# Patient Record
Sex: Female | Born: 1960 | Race: Asian | Hispanic: No | Marital: Married | Smoking: Never smoker
Health system: Southern US, Community
[De-identification: ages and names within clinical notes are randomized; demographics above are authoritative.]

## PROBLEM LIST (undated history)

## (undated) DIAGNOSIS — T7840XA Allergy, unspecified, initial encounter: Secondary | ICD-10-CM

## (undated) DIAGNOSIS — D219 Benign neoplasm of connective and other soft tissue, unspecified: Secondary | ICD-10-CM

## (undated) DIAGNOSIS — Z872 Personal history of diseases of the skin and subcutaneous tissue: Secondary | ICD-10-CM

## (undated) HISTORY — DX: Personal history of diseases of the skin and subcutaneous tissue: Z87.2

## (undated) HISTORY — DX: Allergy, unspecified, initial encounter: T78.40XA

## (undated) HISTORY — DX: Benign neoplasm of connective and other soft tissue, unspecified: D21.9

## (undated) HISTORY — PX: OTHER SURGICAL HISTORY: SHX169

---

## 2009-03-16 ENCOUNTER — Ambulatory Visit: Payer: Self-pay | Admitting: Family Medicine

## 2009-03-16 DIAGNOSIS — J069 Acute upper respiratory infection, unspecified: Secondary | ICD-10-CM | POA: Insufficient documentation

## 2011-09-29 ENCOUNTER — Ambulatory Visit (INDEPENDENT_AMBULATORY_CARE_PROVIDER_SITE_OTHER): Payer: BC Managed Care – PPO | Admitting: Physician Assistant

## 2011-09-29 ENCOUNTER — Encounter: Payer: Self-pay | Admitting: Physician Assistant

## 2011-09-29 VITALS — BP 122/84 | HR 65 | Ht 61.0 in | Wt 105.0 lb

## 2011-09-29 DIAGNOSIS — Z7189 Other specified counseling: Secondary | ICD-10-CM

## 2011-09-29 DIAGNOSIS — T753XXA Motion sickness, initial encounter: Secondary | ICD-10-CM

## 2011-09-29 DIAGNOSIS — Z23 Encounter for immunization: Secondary | ICD-10-CM

## 2011-09-29 DIAGNOSIS — Z789 Other specified health status: Secondary | ICD-10-CM

## 2011-09-29 MED ORDER — CIPROFLOXACIN HCL 500 MG PO TABS: 500.0000 mg | ORAL_TABLET | Freq: Two times a day (BID) | ORAL | Status: AC

## 2011-09-29 MED ORDER — TYPHOID VACCINE PO CPDR: 1.0000 | DELAYED_RELEASE_CAPSULE | ORAL | Status: AC

## 2011-09-29 NOTE — Progress Notes (Signed)
  Subjective:    Patient ID: Karen Copeland, female    DOB: 1960/05/20, 51 y.o.   MRN: [id number]  HPI Patient presents to the clinic to establish care and to get vaccines needed for travel to [place] leaving on June 5th. I discussed with patient that we really needed more time than 2 weeks to get everything done but will do what we can. Patient does not know if she has had Hep A and Hep B vaccines. She does not want Rabies, Japanese encephalitis, or Malaria. She does want yellow fever, tetanus, Hep a and b, and travelers diarrhea prophylaxis.   No other concerns today.    Review of Systems     Objective:   Physical Exam  Constitutional: She is oriented to person, place, and time. She appears well-developed and well-nourished.  HENT:  Head: Normocephalic and atraumatic.  Cardiovascular: Normal rate, regular rhythm and normal heart sounds.   Pulmonary/Chest: Effort normal and breath sounds normal.  Neurological: She is alert and oriented to person, place, and time.  Skin: Skin is warm and dry.  Psychiatric: She has a normal mood and affect. Her behavior is normal.          Assessment & Plan:  Travel, Foreign- Labs were drawn for Hep A and B titers. If no immunity then will give first two doses of 3 dose series. We don't have Yellow fever patient was told to go downstairs to occupational health; however, they do not file with insurance and will be 139 dollars. Patient is unsure that she wants that vaccine. Patient declined Japanese encephalitis, Malaria, Rabies. Typhoid oral vaccine was given rx for to start today. Tetanus will be given when find out if need hep A or B or not. Cipro was given to use if have diarrhea while traveling.  Discussed with patient the need for CPE and Pap. We can talk about referrals and past due healthcare items.

## 2011-09-29 NOTE — Patient Instructions (Signed)
Start tyhphoid oral vaccine today.

## 2011-09-30 LAB — HEPATITIS B CORE ANTIBODY, TOTAL: Hep B Core Total Ab: POSITIVE — AB

## 2011-10-01 ENCOUNTER — Other Ambulatory Visit: Payer: Self-pay | Admitting: Physician Assistant

## 2011-10-01 DIAGNOSIS — Z789 Other specified health status: Secondary | ICD-10-CM

## 2011-10-01 NOTE — Progress Notes (Signed)
Addended by: Ellsworth Lennox on: 10/01/2011 10:31 AM   Modules accepted: Orders

## 2011-10-01 NOTE — Progress Notes (Signed)
Fax these order to lab.

## 2011-10-07 ENCOUNTER — Telehealth: Payer: Self-pay | Admitting: *Deleted

## 2011-10-07 NOTE — Telephone Encounter (Signed)
Pt is calling in regards to what vaccines she needs to travel out of country. Please advise. I don't see where the further testing has come back.

## 2011-10-08 ENCOUNTER — Ambulatory Visit (INDEPENDENT_AMBULATORY_CARE_PROVIDER_SITE_OTHER): Payer: BC Managed Care – PPO | Admitting: Physician Assistant

## 2011-10-08 ENCOUNTER — Encounter: Payer: Self-pay | Admitting: Physician Assistant

## 2011-10-08 VITALS — BP 116/76 | HR 80 | Ht 61.0 in | Wt 104.0 lb

## 2011-10-08 DIAGNOSIS — J069 Acute upper respiratory infection, unspecified: Secondary | ICD-10-CM

## 2011-10-08 DIAGNOSIS — Z789 Other specified health status: Secondary | ICD-10-CM

## 2011-10-08 NOTE — Patient Instructions (Signed)
Start Mucinex-D twice a day drinking lots of water. Stop Zyrtec and allergra. Consider sinus rinses can do those twice a day. For cough continue the tussin at night and delsylm during the day. Honey for cough. Call on Monday before trip if not any better and will call in abx.   Upper Respiratory Infection, Adult An upper respiratory infection (URI) is also known as the common cold. It is often caused by a type of germ (virus). Colds are easily spread (contagious). You can pass it to others by kissing, coughing, sneezing, or drinking out of the same glass. Usually, you get better in 1 or 2 weeks.  HOME CARE   Only take medicine as told by your doctor.   Use a warm mist humidifier or breathe in steam from a hot shower.   Drink enough water and fluids to keep your pee (urine) clear or pale yellow.   Get plenty of rest.   Return to work when your temperature is back to normal or as told by your doctor. You may use a face mask and wash your hands to stop your cold from spreading.  GET HELP RIGHT AWAY IF:   After the first few days, you feel you are getting worse.   You have questions about your medicine.   You have chills, shortness of breath, or brown or red spit (mucus).   You have yellow or brown snot (nasal discharge) or pain in the face, especially when you bend forward.   You have a fever, puffy (swollen) neck, pain when you swallow, or white spots in the back of your throat.   You have a bad headache, ear pain, sinus pain, or chest pain.   You have a high-pitched whistling sound when you breathe in and out (wheezing).   You have a lasting cough or cough up blood.   You have sore muscles or a stiff neck.  MAKE SURE YOU:   Understand these instructions.   Will watch your condition.   Will get help right away if you are not doing well or get worse.  Document Released: 10/15/2007 Document Revised: 04/17/2011 Document Reviewed: 09/02/2010 [facility] Patient Information 2012  [place], [place].

## 2011-10-08 NOTE — Progress Notes (Signed)
  Subjective:    Patient ID: Karen Copeland, female    DOB: 01-26-1961, 51 y.o.   MRN: [id number]  HPI Patient presents to the clinic with cold like symptoms for 4 days. She has had an non productive cough and sinus pressure. Denies sore throat, fever, SOB, or ear pain. She has had some watery eyes. The cough is most bothersome. Tried Zyrtec, Allergra, Tussin. She feels like she is getting more congestion.   Patient is due to travel to [place]. We have discussed vaccines needed. She decline some b/c of cost. She needs Hep A and tetanus for sure. He does not want today b/c feels bad.    Review of Systems     Objective:   Physical Exam  Constitutional: She is oriented to person, place, and time. She appears well-developed and well-nourished.  HENT:  Head: Normocephalic and atraumatic.  Right Ear: External ear normal.  Left Ear: External ear normal.  Mouth/Throat: Oropharynx is clear and moist. No oropharyngeal exudate.       TM's normal. No maxillary tenderness. Bilateral turbinates red and swollen.   Eyes:       Watery discharge from both eyes.   Neck: Normal range of motion. Neck supple.  Cardiovascular: Normal rate, regular rhythm and normal heart sounds.   Pulmonary/Chest: Effort normal and breath sounds normal. She has no wheezes.  Lymphadenopathy:    She has no cervical adenopathy.  Neurological: She is alert and oriented to person, place, and time.  Skin: Skin is warm and dry.  Psychiatric: She has a normal mood and affect. Her behavior is normal.          Assessment & Plan:  URI-Start Mucinex-D twice a day drinking lots of water. Stop Zyrtec and allergra. Consider sinus rinses can do those twice a day. For cough continue the tussin at night and delsylm during the day. Honey for cough. Call on Monday before trip if not any better and will call in abx.   Travel- Patient did not want Hep A or tetanus shot today because feeling bad. She is aware of risk and patient was  encouraged to get vaccines. Hep B antibody were positive further testing was ordered to make sure not an active infection but due to immunitiy. Will call with results.

## 2011-10-08 NOTE — Telephone Encounter (Signed)
Discussed at appt on 10/08/11.

## 2011-10-10 ENCOUNTER — Other Ambulatory Visit: Payer: Self-pay | Admitting: Physician Assistant

## 2011-10-10 DIAGNOSIS — Z789 Other specified health status: Secondary | ICD-10-CM

## 2011-10-13 ENCOUNTER — Telehealth: Payer: Self-pay | Admitting: *Deleted

## 2011-10-13 MED ORDER — METHYLPREDNISOLONE 4 MG PO KIT: PACK | ORAL | Status: AC

## 2011-10-13 NOTE — Telephone Encounter (Signed)
Pt states that she still has congestion and that now her ears are stopped up. States she leaves to go out of country on Wednesday. Please advise.

## 2011-10-13 NOTE — Telephone Encounter (Signed)
Pt informed

## 2011-10-13 NOTE — Telephone Encounter (Signed)
Take medrol dose pak as directed on package instructions.

## 2011-11-17 ENCOUNTER — Encounter: Payer: Self-pay | Admitting: *Deleted

## 2012-12-31 ENCOUNTER — Encounter: Payer: Self-pay | Admitting: Obstetrics and Gynecology

## 2012-12-31 ENCOUNTER — Ambulatory Visit (INDEPENDENT_AMBULATORY_CARE_PROVIDER_SITE_OTHER): Payer: BC Managed Care – PPO | Admitting: Obstetrics and Gynecology

## 2012-12-31 VITALS — BP 100/58 | HR 88 | Ht 62.5 in | Wt 105.0 lb

## 2012-12-31 DIAGNOSIS — L659 Nonscarring hair loss, unspecified: Secondary | ICD-10-CM

## 2012-12-31 DIAGNOSIS — Z23 Encounter for immunization: Secondary | ICD-10-CM

## 2012-12-31 DIAGNOSIS — Z01419 Encounter for gynecological examination (general) (routine) without abnormal findings: Secondary | ICD-10-CM

## 2012-12-31 NOTE — Progress Notes (Signed)
Patient ID: Karen Copeland, female   DOB: 06/09/1960, 52 y.o.   MRN: [id number] 52 y.o.   Married    Asian   female   [id number]   here for annual exam.    Hot flashes not troublesome. Notes some hair loss. Has had thyroid check in the past.  Sees Dermatology for melasma - Dr. Verdie Mosher in [place].  Some vagina dryness  Patient's last menstrual period was 05/12/2010.          Sexually active: yes  The current method of family planning is post menopausal status.    Exercising: walks 4 days/week Last mammogram:  2012 [facility] Last pap smear: 2012 wnl History of abnormal pap: no Smoking: no Alcohol: no Last colonoscopy: never Last Bone Density:  never Last tetanus shot: greater than 10 years Last cholesterol check: 2013 [facility] work - [employer]  Hgb:                Urine:    Family History  Problem Relation Age of Onset  . Ovarian cancer Mother     ?poss. ovarian--unsure  . Diabetes Brother     as a child--but not as an adult    Patient Active Problem List   Diagnosis Date Noted  . URI 03/16/2009    Past Medical History  Diagnosis Date  . Allergy   . H/O melasma     History reviewed. No pertinent past surgical history.  Allergies: Review of patient's allergies indicates no known allergies.  Current Outpatient Prescriptions  Medication Sig Dispense Refill  . Multiple Vitamin (MULTIVITAMIN) capsule Take 1 capsule by mouth daily.      . TRI-LUMA 0.01-4-0.05 % CREA Apply 1 application topically as needed.      . Triprolidine-Pseudoephedrine (ANTIHISTAMINE PO) Take by mouth.       No current facility-administered medications for this visit.    ROS: Pertinent items are noted in HPI.  Social Hx:  Chemistry professor.  Exam:    BP 100/58  Pulse 88  Ht 5' 2.5" (1.588 m)  Wt 105 lb (47.628 kg)  BMI 18.89 kg/m2  LMP 05/12/2010   Wt Readings from Last 3 Encounters:  12/31/12 105 lb (47.628 kg)  10/08/11 104 lb (47.174 kg)  09/29/11 105 lb  (47.628 kg)     Ht Readings from Last 3 Encounters:  12/31/12 5' 2.5" (1.588 m)  10/08/11 5\' 1"  (1.549 m)  09/29/11 5\' 1"  (1.549 m)    General appearance: alert, cooperative and appears stated age Head: Normocephalic, without obvious abnormality, atraumatic Neck: no adenopathy, supple, symmetrical, trachea midline and thyroid not enlarged, symmetric, no tenderness/mass/nodules Lungs: clear to auscultation bilaterally Breasts: Inspection negative, No nipple retraction or dimpling, No nipple discharge or bleeding, No axillary or supraclavicular adenopathy, Normal to palpation without dominant masses Heart: regular rate and rhythm Abdomen: soft, non-tender; bowel sounds normal; no masses,  no organomegaly Extremities: extremities normal, atraumatic, no cyanosis or edema Skin: Skin color, texture, turgor normal. No rashes or lesions Lymph nodes: Cervical, supraclavicular, and axillary nodes normal. No abnormal inguinal nodes palpated Neurologic: Grossly normal   Pelvic: External genitalia:  no lesions              Urethra:  normal appearing urethra with no masses, tenderness or lesions              Bartholins and Skenes: normal                 Vagina: normal appearing vagina with  normal color and discharge, no lesions              Cervix: normal appearance              Pap taken: yes and high risk HPV        Bimanual Exam:  Uterus:  uterus is normal size, shape, consistency and nontender                                      Adnexa: normal adnexa in size, nontender and no masses                                      Rectovaginal: Confirms                                      Anus:  normal sphincter tone, no lesions  A: normal menopausal exam Atrophic vaginal changes.  Alpecia  P:     Mammogram due.  Order placed.  [facility] smear and high risk HPV testing. Discussed oil based lubricants and vitamin E suppositories for vaginal dryness. Discussed strategies for  osteoporosis prevention. TSH TDap today return annually or prn     An After Visit Summary was printed and given to the patient.

## 2012-12-31 NOTE — Patient Instructions (Signed)
EXERCISE AND DIET:  We recommended that you start or continue a regular exercise program for good health. Regular exercise means any activity that makes your heart beat faster and makes you sweat.  We recommend exercising at least 30 minutes per day at least 3 days a week, preferably 4 or 5.  We also recommend a diet low in fat and sugar.  Inactivity, poor dietary choices and obesity can cause diabetes, heart attack, stroke, and kidney damage, among others.    ALCOHOL AND SMOKING:  Women should limit their alcohol intake to no more than 7 drinks/beers/glasses of wine (combined, not each!) per week. Moderation of alcohol intake to this level decreases your risk of breast cancer and liver damage. And of course, no recreational drugs are part of a healthy lifestyle.  And absolutely no smoking or even second hand smoke. Most people know smoking can cause heart and lung diseases, but did you know it also contributes to weakening of your bones? Aging of your skin?  Yellowing of your teeth and nails?  CALCIUM AND VITAMIN D:  Adequate intake of calcium and Vitamin D are recommended.  The recommendations for exact amounts of these supplements seem to change often, but generally speaking 600 mg of calcium (either carbonate or citrate) and 800 units of Vitamin D per day seems prudent. Certain women may benefit from higher intake of Vitamin D.  If you are among these women, your doctor will have told you during your visit.    PAP SMEARS:  Pap smears, to check for cervical cancer or precancers,  have traditionally been done yearly, although recent scientific advances have shown that most women can have pap smears less often.  However, every woman still should have a physical exam from her gynecologist every year. It will include a breast check, inspection of the vulva and vagina to check for abnormal growths or skin changes, a visual exam of the cervix, and then an exam to evaluate the size and shape of the uterus and  ovaries.  And after 53 years of age, a rectal exam is indicated to check for rectal cancers. We will also provide age appropriate advice regarding health maintenance, like when you should have certain vaccines, screening for sexually transmitted diseases, bone density testing, colonoscopy, mammograms, etc.   MAMMOGRAMS:  All women over 40 years old should have a yearly mammogram. Many facilities now offer a "3D" mammogram, which may cost around $50 extra out of pocket. If possible,  we recommend you accept the option to have the 3D mammogram performed.  It both reduces the number of women who will be called back for extra views which then turn out to be normal, and it is better than the routine mammogram at detecting truly abnormal areas.    COLONOSCOPY:  Colonoscopy to screen for colon cancer is recommended for all women at age 50.  We know, you hate the idea of the prep.  We agree, BUT, having colon cancer and not knowing it is worse!!  Colon cancer so often starts as a polyp that can be seen and removed at colonscopy, which can quite literally save your life!  And if your first colonoscopy is normal and you have no family history of colon cancer, most women don't have to have it again for 10 years.  Once every ten years, you can do something that may end up saving your life, right?  We will be happy to help you get it scheduled when you are ready.    Be sure to check your insurance coverage so you understand how much it will cost.  It may be covered as a preventative service at no cost, but you should check your particular policy.    Tetanus, Diphtheria, Pertussis (Tdap) Vaccine What You Need to Know WHY GET VACCINATED? Tetanus, diphtheria and pertussis can be very serious diseases, even for adolescents and adults. Tdap vaccine can protect us from these diseases. TETANUS (Lockjaw) causes painful muscle tightening and stiffness, usually all over the body.  It can lead to tightening of muscles in the head  and neck so you can't open your mouth, swallow, or sometimes even breathe. Tetanus kills about 1 out of 5 people who are infected. DIPHTHERIA can cause a thick coating to form in the back of the throat.  It can lead to breathing problems, paralysis, heart failure, and death. PERTUSSIS (Whooping Cough) causes severe coughing spells, which can cause difficulty breathing, vomiting and disturbed sleep.  It can also lead to weight loss, incontinence, and rib fractures. Up to 2 in 100 adolescents and 5 in 100 adults with pertussis are hospitalized or have complications, which could include pneumonia and death. These diseases are caused by bacteria. Diphtheria and pertussis are spread from person to person through coughing or sneezing. Tetanus enters the body through cuts, scratches, or wounds. Before vaccines, the [place] saw as many as 200,000 cases a year of diphtheria and pertussis, and hundreds of cases of tetanus. Since vaccination began, tetanus and diphtheria have dropped by about 99% and pertussis by about 80%. TDAP VACCINE Tdap vaccine can protect adolescents and adults from tetanus, diphtheria, and pertussis. One dose of Tdap is routinely given at age 11 or 12. People who did not get Tdap at that age should get it as soon as possible. Tdap is especially important for health care professionals and anyone having close contact with a baby younger than 12 months. Pregnant women should get a dose of Tdap during every pregnancy, to protect the newborn from pertussis. Infants are most at risk for severe, life-threatening complications from pertussis. A similar vaccine, called Td, protects from tetanus and diphtheria, but not pertussis. A Td booster should be given every 10 years. Tdap may be given as one of these boosters if you have not already gotten a dose. Tdap may also be given after a severe cut or burn to prevent tetanus infection. Your doctor can give you more information. Tdap may safely  be given at the same time as other vaccines. SOME PEOPLE SHOULD NOT GET THIS VACCINE  If you ever had a life-threatening allergic reaction after a dose of any tetanus, diphtheria, or pertussis containing vaccine, OR if you have a severe allergy to any part of this vaccine, you should not get Tdap. Tell your doctor if you have any severe allergies.  If you had a coma, or long or multiple seizures within 7 days after a childhood dose of DTP or DTaP, you should not get Tdap, unless a cause other than the vaccine was found. You can still get Td.  Talk to your doctor if you:  have epilepsy or another nervous system problem,  had severe pain or swelling after any vaccine containing diphtheria, tetanus or pertussis,  ever had Guillain-Barr Syndrome (GBS),  aren't feeling well on the day the shot is scheduled. RISKS OF A VACCINE REACTION With any medicine, including vaccines, there is a chance of side effects. These are usually mild and go away on their own, but serious   reactions are also possible. Brief fainting spells can follow a vaccination, leading to injuries from falling. Sitting or lying down for about 15 minutes can help prevent these. Tell your doctor if you feel dizzy or light-headed, or have vision changes or ringing in the ears. Mild problems following Tdap (Did not interfere with activities)  Pain where the shot was given (about 3 in 4 adolescents or 2 in 3 adults)  Redness or swelling where the shot was given (about 1 person in 5)  Mild fever of at least 100.4F (up to about 1 in 25 adolescents or 1 in 100 adults)  Headache (about 3 or 4 people in 10)  Tiredness (about 1 person in 3 or 4)  Nausea, vomiting, diarrhea, stomach ache (up to 1 in 4 adolescents or 1 in 10 adults)  Chills, body aches, sore joints, rash, swollen glands (uncommon) Moderate problems following Tdap (Interfered with activities, but did not require medical attention)  Pain where the shot was given  (about 1 in 5 adolescents or 1 in 100 adults)  Redness or swelling where the shot was given (up to about 1 in 16 adolescents or 1 in 25 adults)  Fever over 102F (about 1 in 100 adolescents or 1 in 250 adults)  Headache (about 3 in 20 adolescents or 1 in 10 adults)  Nausea, vomiting, diarrhea, stomach ache (up to 1 or 3 people in 100)  Swelling of the entire arm where the shot was given (up to about 3 in 100). Severe problems following Tdap (Unable to perform usual activities, required medical attention)  Swelling, severe pain, bleeding and redness in the arm where the shot was given (rare). A severe allergic reaction could occur after any vaccine (estimated less than 1 in a million doses). WHAT IF THERE IS A SERIOUS REACTION? What should I look for?  Look for anything that concerns you, such as signs of a severe allergic reaction, very high fever, or behavior changes. Signs of a severe allergic reaction can include hives, swelling of the face and throat, difficulty breathing, a fast heartbeat, dizziness, and weakness. These would start a few minutes to a few hours after the vaccination. What should I do?  If you think it is a severe allergic reaction or other emergency that can't wait, call 9-1-1 or get the person to the nearest hospital. Otherwise, call your doctor.  Afterward, the reaction should be reported to the "Vaccine Adverse Event Reporting System" (VAERS). Your doctor might file this report, or you can do it yourself through the VAERS web site at www.vaers.hhs.gov, or by calling 1-800-822-7967. VAERS is only for reporting reactions. They do not give medical advice.  THE NATIONAL VACCINE INJURY COMPENSATION PROGRAM The National Vaccine Injury Compensation Program (VICP) is a federal program that was created to compensate people who may have been injured by certain vaccines. Persons who believe they may have been injured by a vaccine can learn about the program and about filing a  claim by calling 1-800-338-2382 or visiting the VICP website at www.hrsa.gov/vaccinecompensation. HOW CAN I LEARN MORE?  Ask your doctor.  Call your local or state health department.  Contact the Centers for Disease Control and Prevention (CDC):  Call 1-800-232-4636 or visit CDC's website at www.cdc.gov/vaccines. CDC Tdap Vaccine VIS (09/18/11) Document Released: 10/28/2011 Document Revised: 01/21/2012 Document Reviewed: 10/28/2011 [employer] Patient Information 2014 [employer].  

## 2013-01-01 LAB — TSH: TSH: 1.561 u[IU]/mL (ref 0.350–4.500)

## 2013-01-03 ENCOUNTER — Other Ambulatory Visit: Payer: Self-pay

## 2013-01-03 DIAGNOSIS — Z1231 Encounter for screening mammogram for malignant neoplasm of breast: Secondary | ICD-10-CM

## 2013-01-04 ENCOUNTER — Telehealth: Payer: Self-pay

## 2013-01-04 NOTE — Telephone Encounter (Signed)
Message copied by Alphonsa Overall on Tue Jan 04, 2013  2:58 PM ------      Message from: Conley Simmonds      Created: Sat Jan 01, 2013  9:04 AM       Please inform patient of normal TSH. ------

## 2013-01-04 NOTE — Telephone Encounter (Signed)
Patient notified TSH normal.

## 2013-01-04 NOTE — Telephone Encounter (Signed)
LMOVM to call for lab results. 

## 2013-01-04 NOTE — Telephone Encounter (Signed)
No call.  Done in error.

## 2013-01-04 NOTE — Telephone Encounter (Signed)
Message copied by Alphonsa Overall on Tue Jan 04, 2013  2:33 PM ------      Message from: Conley Simmonds      Created: Sat Jan 01, 2013  9:04 AM       Please inform patient of normal TSH. ------

## 2013-01-05 LAB — IPS PAP TEST WITH HPV

## 2013-02-07 ENCOUNTER — Encounter: Payer: Self-pay | Admitting: Internal Medicine

## 2013-02-21 ENCOUNTER — Ambulatory Visit
Admission: RE | Admit: 2013-02-21 | Discharge: 2013-02-21 | Disposition: A | Discharge status: Home / Self Care | Payer: BC Managed Care – PPO | Source: Ambulatory Visit

## 2013-02-21 DIAGNOSIS — Z1231 Encounter for screening mammogram for malignant neoplasm of breast: Secondary | ICD-10-CM

## 2013-03-17 ENCOUNTER — Other Ambulatory Visit: Payer: Self-pay

## 2013-03-28 ENCOUNTER — Encounter: Payer: Self-pay | Admitting: Physician Assistant

## 2013-03-28 ENCOUNTER — Ambulatory Visit (INDEPENDENT_AMBULATORY_CARE_PROVIDER_SITE_OTHER): Payer: BC Managed Care – PPO | Admitting: Physician Assistant

## 2013-03-28 VITALS — BP 108/65 | HR 72 | Wt 109.0 lb

## 2013-03-28 DIAGNOSIS — B353 Tinea pedis: Secondary | ICD-10-CM

## 2013-03-28 MED ORDER — NAFTIFINE HCL 1 % EX CREA: TOPICAL_CREAM | Freq: Every day | CUTANEOUS | Status: DC

## 2013-03-28 MED ORDER — ACYCLOVIR 200 MG PO CAPS: 200.0000 mg | ORAL_CAPSULE | Freq: Every day | ORAL | Status: DC

## 2013-03-28 NOTE — Patient Instructions (Signed)
Athlete's Foot Athlete's foot (tinea pedis) is a fungal infection of the skin on the feet. It often occurs on the skin between the toes or underneath the toes. It can also occur on the soles of the feet. Athlete's foot is more likely to occur in hot, humid weather. Not washing your feet or changing your socks often enough can contribute to athlete's foot. The infection can spread from person to person (contagious). CAUSES Athlete's foot is caused by a fungus. This fungus thrives in warm, moist places. Most people get athlete's foot by sharing shower stalls, towels, and wet floors with an infected person. People with weakened immune systems, including those with diabetes, may be more likely to get athlete's foot. SYMPTOMS   Itchy areas between the toes or on the soles of the feet.  White, flaky, or scaly areas between the toes or on the soles of the feet.  Tiny, intensely itchy blisters between the toes or on the soles of the feet.  Tiny cuts on the skin. These cuts can develop a bacterial infection.  Thick or discolored toenails. DIAGNOSIS  Your caregiver can usually tell what the problem is by doing a physical exam. Your caregiver may also take a skin sample from the rash area. The skin sample may be examined under a microscope, or it may be tested to see if fungus will grow in the sample. A sample may also be taken from your toenail for testing. TREATMENT  Over-the-counter and prescription medicines can be used to kill the fungus. These medicines are available as powders or creams. Your caregiver can suggest medicines for you. Fungal infections respond slowly to treatment. You may need to continue using your medicine for several weeks. PREVENTION   Do not share towels.  Wear sandals in wet areas, such as shared locker rooms and shared showers.  Keep your feet dry. Wear shoes that allow air to circulate. Wear cotton or wool socks. HOME CARE INSTRUCTIONS   Take medicines as directed by  your caregiver. Do not use steroid creams on athlete's foot.  Keep your feet clean and cool. Wash your feet daily and dry them thoroughly, especially between your toes.  Change your socks every day. Wear cotton or wool socks. In hot climates, you may need to change your socks 2 to 3 times per day.  Wear sandals or canvas tennis shoes with good air circulation.  If you have blisters, soak your feet in Burow's solution or Epsom salts for 20 to 30 minutes, 2 times a day to dry out the blisters. Make sure you dry your feet thoroughly afterward. SEEK MEDICAL CARE IF:   You have a fever.  You have swelling, soreness, warmth, or redness in your foot.  You are not getting better after 7 days of treatment.  You are not completely cured after 30 days.  You have any problems caused by your medicines. MAKE SURE YOU:   Understand these instructions.  Will watch your condition.  Will get help right away if you are not doing well or get worse. Document Released: 04/25/2000 Document Revised: 07/21/2011 Document Reviewed: 02/14/2011 [employer] Patient Information 2014 [employer].  

## 2013-03-28 NOTE — Progress Notes (Signed)
  Subjective:    Patient ID: Karen Copeland, female    DOB: [birth year], 52 y.o.   MRN: [id number]  HPI Patient presents to the clinic with left foot red spots around great toe and in between 1st and 2nd metatarsals for the last 2 weeks. She denies any itching or pain. Toes did have some burning but pt put lamsil on toes and burning went away. She has put lamisil on for 3 days. She is concerned because of the white spots on great toe that are popping up.   Review of Systems     Objective:   Physical Exam  Skin:  Erythema around left great toe and in between 1st and 2nd metatarsal. Scaling and maceration in between 1st and 2nd metatarsal. There were 6 pinpoint white dots almost like pimples under the skin on the lateral side of great toe. NO pain to palpation.           Assessment & Plan:  Tinea pedis- I feel like presentation and response to lamisil is a fungus. The white spots, erythema, and burning make me suspicious of something viral/herpetic. I gave naftin to treat fungus. Discussed fungus sometimes take time to treat but if not improving or worsening stop and take antiviral for 5 days. Gave HO for prevention of fungus.

## 2013-04-01 ENCOUNTER — Telehealth: Payer: Self-pay | Admitting: Obstetrics and Gynecology

## 2013-04-01 NOTE — Telephone Encounter (Signed)
Patient calling saying she is post menopausal and has been for the last two years but she is now having vaginal bleeding. Please advise?

## 2013-04-01 NOTE — Telephone Encounter (Signed)
Message left to return call to Karen Copeland at [phone].    

## 2013-04-01 NOTE — Telephone Encounter (Signed)
Thank for scheduling this visit.  I agree with the importance of evaluating her bleeding. Marland Kitchen

## 2013-04-01 NOTE — Telephone Encounter (Signed)
Patient returned Tracy's call.

## 2013-04-01 NOTE — Telephone Encounter (Signed)
I spoke with patient. States that she has been spotting x 3 days and now bleeding has increased to "its like a period now" per patient, changing pad q 3 hours.  Patient feels it may be related to starting new medications Zovirax and Naftin for fungal/viral issues on foot, she is also doing bowel prep for colonoscopy. She states that she has hx of fibroids as well. I advised patient that it is very important to be evaluated for post menopausal bleeding, as this is not a normal process and necessitates physician evaluation. Patient states "I just want to wait it out". Patient declines appointment today with Dr. Edward Jolly as she does not want to be evaluated while bleeding. She requests an appointment after Thanksgiving holiday. I advised patient again that it is important to be seen in a timely manner and offered patient Wednesday 11/26 at 0800. Patient is agreeable to this appointment and advised patient  to call back or seek immediate medical care if bleeding worsens or soaking through 1 pad/tampon per hour.  Patient verbalized understanding  Routing to provider for final review. Patient agreeable to disposition. Will close encounter

## 2013-04-04 ENCOUNTER — Encounter: Payer: Self-pay | Admitting: Internal Medicine

## 2013-04-04 ENCOUNTER — Ambulatory Visit (AMBULATORY_SURGERY_CENTER): Payer: Self-pay | Admitting: *Deleted

## 2013-04-04 ENCOUNTER — Telehealth: Payer: Self-pay | Admitting: *Deleted

## 2013-04-04 VITALS — Ht 62.0 in | Wt 109.6 lb

## 2013-04-04 DIAGNOSIS — Z1211 Encounter for screening for malignant neoplasm of colon: Secondary | ICD-10-CM

## 2013-04-04 MED ORDER — MOVIPREP 100 G PO SOLR: ORAL | Status: DC

## 2013-04-04 NOTE — Progress Notes (Signed)
No allergies to eggs or soy. No prior anesthesia.  

## 2013-04-04 NOTE — Telephone Encounter (Signed)
Dr Juanda Chance: pt is scheduled for direct screening colonoscopy 04/22/13.  She has been having constipation problems for 1 month.  She has a BM about every 5 days only after taking Dulcolax.  She describes her stool as hard and pellet size.  Does she need 2 day prep in addition to routine MoviPrep? Is she okay for direct?  Please advise.  Thanks, Olegario Messier

## 2013-04-04 NOTE — Telephone Encounter (Signed)
2 day prep would be the safe thing to do.

## 2013-04-04 NOTE — Telephone Encounter (Signed)
Left message on pt's voicemail to do 2 day prep instructions as reviewed in PV.

## 2013-04-06 ENCOUNTER — Ambulatory Visit (INDEPENDENT_AMBULATORY_CARE_PROVIDER_SITE_OTHER): Payer: BC Managed Care – PPO | Admitting: Obstetrics and Gynecology

## 2013-04-06 ENCOUNTER — Encounter: Payer: Self-pay | Admitting: Obstetrics and Gynecology

## 2013-04-06 VITALS — BP 100/80 | HR 70 | Resp 16 | Ht 62.5 in | Wt 110.5 lb

## 2013-04-06 DIAGNOSIS — N950 Postmenopausal bleeding: Secondary | ICD-10-CM

## 2013-04-06 NOTE — Patient Instructions (Signed)
Postmenopausal Bleeding Menopause is commonly referred to as the "change in life." It is a time when the fertile years, the time of ovulating and having menstrual periods, has come to an end. It is also determined by not having menstrual periods for 12 months.  Postmenopausal bleeding is any bleeding a woman has after she has entered into menopause. Any type of postmenopausal bleeding, even if it appears to be a typical menstrual period, is concerning. This should be evaluated by your caregiver.  CAUSES   Hormone therapy.  Cancer of the cervix or cancer of the lining of the uterus (endometrial cancer).  Thinning of the uterine lining (uterine atrophy).  Thyroid diseases.  Certain medicines.  Infection of the uterus or cervix.  Inflammation or irritation of the uterine lining (endometritis).  Estrogen-secreting tumors.  Growths (polyps) on the cervix, uterine lining, or uterus.  Uterine tumors (fibroids).  Being very overweight (obese). DIAGNOSIS  Your caregiver will take a medical history and ask questions. A physical exam will also be performed. Further tests may include:   A transvaginal ultrasound. An ultrasound wand or probe is inserted into your vagina to view the pelvic organs.  A biopsy of the lining of the uterus (endometrium). A sample of the endometrium is removed and examined.  A hysteroscopy. Your caregiver may use an instrument with a light and a camera attached to it (hysteroscope). The hysteroscope is used to look inside the uterus for problems.  A dilation and curettage (D&C). Tissue is removed from the uterine lining to be examined for problems. TREATMENT  Treatment depends on the cause of the bleeding. Some treatments include:   Surgery.  Medicines.  Hormones.  A hysteroscopy or D&C to remove polyps or fibroids.  Changing or stopping a current medicine you are taking. Talk to your caregiver about your specific treatment. HOME CARE INSTRUCTIONS    Maintain a healthy weight.  Keep regular pelvic exams and Pap tests. SEEK MEDICAL CARE IF:   You have bleeding, even if it is light in comparison to your previous periods.  Your bleeding lasts more than 1 week.  You have abdominal pain.  You develop bleeding with sexual intercourse. SEEK IMMEDIATE MEDICAL CARE IF:   You have a fever, chills, headache, dizziness, muscle aches, and bleeding.  You have severe pain with bleeding.  You are passing blood clots.  You have bleeding and need more than 1 pad an hour.  You feel faint. MAKE SURE YOU:  Understand these instructions.  Will watch your condition.  Will get help right away if you are not doing well or get worse. Document Released: 08/06/2005 Document Revised: 07/21/2011 Document Reviewed: 11/25/2012 [facility] Patient Information 2014 [place], [place].  Constipation, Adult Constipation is when a person has fewer than 3 bowel movements a week; has difficulty having a bowel movement; or has stools that are dry, hard, or larger than normal. As people grow older, constipation is more common. If you try to fix constipation with medicines that make you have a bowel movement (laxatives), the problem may get worse. Long-term laxative use may cause the muscles of the colon to become weak. A low-fiber diet, not taking in enough fluids, and taking certain medicines may make constipation worse. CAUSES   Certain medicines, such as antidepressants, pain medicine, iron supplements, antacids, and water pills.   Certain diseases, such as diabetes, irritable bowel syndrome (IBS), thyroid disease, or depression.   Not drinking enough water.   Not eating enough fiber-rich foods.   Stress  or travel.  Lack of physical activity or exercise.  Not going to the restroom when there is the urge to have a bowel movement.  Ignoring the urge to have a bowel movement.  Using laxatives too much. SYMPTOMS   Having fewer than 3 bowel  movements a week.   Straining to have a bowel movement.   Having hard, dry, or larger than normal stools.   Feeling full or bloated.   Pain in the lower abdomen.  Not feeling relief after having a bowel movement. DIAGNOSIS  Your caregiver will take a medical history and perform a physical exam. Further testing may be done for severe constipation. Some tests may include:   A barium enema X-ray to examine your rectum, colon, and sometimes, your small intestine.  A sigmoidoscopy to examine your lower colon.  A colonoscopy to examine your entire colon. TREATMENT  Treatment will depend on the severity of your constipation and what is causing it. Some dietary treatments include drinking more fluids and eating more fiber-rich foods. Lifestyle treatments may include regular exercise. If these diet and lifestyle recommendations do not help, your caregiver may recommend taking over-the-counter laxative medicines to help you have bowel movements. Prescription medicines may be prescribed if over-the-counter medicines do not work.  HOME CARE INSTRUCTIONS   Increase dietary fiber in your diet, such as fruits, vegetables, whole grains, and beans. Limit high-fat and processed sugars in your diet, such as [place] fries, hamburgers, cookies, candies, and soda.   A fiber supplement may be added to your diet if you cannot get enough fiber from foods.   Drink enough fluids to keep your urine clear or pale yellow.   Exercise regularly or as directed by your caregiver.   Go to the restroom when you have the urge to go. Do not hold it.  Only take medicines as directed by your caregiver. Do not take other medicines for constipation without talking to your caregiver first. SEEK IMMEDIATE MEDICAL CARE IF:   You have bright red blood in your stool.   Your constipation lasts for more than 4 days or gets worse.   You have abdominal or rectal pain.   You have thin, pencil-like stools.  You  have unexplained weight loss. MAKE SURE YOU:   Understand these instructions.  Will watch your condition.  Will get help right away if you are not doing well or get worse. Document Released: 01/25/2004 Document Revised: 07/21/2011 Document Reviewed: 04/01/2011 [facility] Patient Information 2014 [place], [place].

## 2013-04-06 NOTE — Progress Notes (Signed)
Patient ID: Karen Copeland, female   DOB: 1960-07-28, 52 y.o.   MRN: [id number] GYNECOLOGY PROBLEM VISIT  PCP:   Dr. Caleen Essex  Referring provider:   HPI: 52 y.o.   Married  Asian  female   G1P1001 with Patient's last menstrual period was 05/12/2010.   here for   Postmenopausal bleeding. Patient states recently took an antiviral and had postmenopausal bleeding.  Bleeding started on 03/30/13 spotting for 3 days and then became heavy bleeding, now light brown.  This did not follow intercourse.  No pain.  Thinks she may have had some spotting one or more years ago.  No known fibroids.  Not on any hormone therapy.   Patient's mother did not have a cancer as far as the patient knows.   Patient is having constipation problems.  Hard pellets.  Using Dulcolax.  Diarrhea preceded this after going out for a meal. Has a colonoscopy December 12.   GYNECOLOGIC HISTORY: Patient's last menstrual period was 05/12/2010. Sexually active: yes  Partner preference: female Contraception:   postmenopausal Menopausal hormone therapy: no DES exposure:   no Blood transfusions:  no  Sexually transmitted diseases: no   GYN Procedures:  none Mammogram:    02-23-13 wnl:[facility]             Pap:   12-31-12 wnl:neg HR HPV History of abnormal pap smear:  no   OB History   Grav Para Term Preterm Abortions TAB SAB Ect Mult Living   1 1 1       1          Family History  Problem Relation Age of Onset  . Ovarian cancer Mother     ?poss. ovarian--unsure  . Diabetes Brother     as a child--but not as an adult  . Colon cancer Neg Hx     Patient Active Problem List   Diagnosis Date Noted  . URI 03/16/2009    Past Medical History  Diagnosis Date  . Allergy   . H/O melasma     Past Surgical History  Procedure Laterality Date  . No prior surgery      ALLERGIES: Review of patient's allergies indicates no known allergies.  Current Outpatient Prescriptions  Medication Sig Dispense  Refill  . acyclovir (ZOVIRAX) 200 MG capsule Take 1 capsule (200 mg total) by mouth 5 (five) times daily. For 5 days.  25 capsule  0  . MOVIPREP 100 G SOLR moviprep as directed. No substitutions  1 kit  0  . Multiple Vitamin (MULTIVITAMIN) capsule Take 1 capsule by mouth daily.      . naftifine (NAFTIN) 1 % cream Apply topically daily. For 2 weeks.  30 g  0   No current facility-administered medications for this visit.     ROS:  Pertinent items are noted in HPI.  SOCIAL HISTORY:  Works for [employer].  PHYSICAL EXAMINATION:    BP 100/80  Pulse 70  Resp 16  Ht 5' 2.5" (1.588 m)  Wt 110 lb 8 oz (50.122 kg)  BMI 19.88 kg/m2  LMP 05/12/2010   Wt Readings from Last 3 Encounters:  04/06/13 110 lb 8 oz (50.122 kg)  04/04/13 109 lb 9.6 oz (49.714 kg)  03/28/13 109 lb (49.442 kg)     Ht Readings from Last 3 Encounters:  04/06/13 5' 2.5" (1.588 m)  04/04/13 5\' 2"  (1.575 m)  12/31/12 5' 2.5" (1.588 m)    General appearance: alert, cooperative and appears stated age Neurologic: Grossly normal  Pelvic: External genitalia:  no lesions              Urethra:  normal appearing urethra with no masses, tenderness or lesions              Bartholins and Skenes: normal                 Vagina: normal appearing vagina with normal color and discharge, no lesions              Cervix: normal appearance                   Bimanual Exam:  Uterus:  uterus is normal size, shape, consistency and nontender, retroverted.                                       Adnexa: normal adnexa in size, nontender and no masses                                         ASSESSMENT  Postmenopausal bleeding. Constipation.   PLAN  I discussed possible etiologies of postmenopausal bleeding and proper evaluation including sonohysterogram and EMB. Will precert these. Patient understands that this evaluation may result in further treatment or surgery. We discussed constipation and stopping her MVI with iron.  She is  scheduled for a colonoscopy.    An After Visit Summary was printed and given to the patient.  25 minutes face to face time of which over 50% was spent in counseling.

## 2013-04-11 ENCOUNTER — Telehealth: Payer: Self-pay | Admitting: Obstetrics and Gynecology

## 2013-04-11 NOTE — Telephone Encounter (Signed)
Pt calling to schedule an ultrasound appointment. °

## 2013-04-11 NOTE — Telephone Encounter (Signed)
Message left to return call to Karen Copeland at [phone].    

## 2013-04-13 NOTE — Telephone Encounter (Signed)
LMTCB to discuss ins benefits for upcoming [facility].  °

## 2013-04-14 NOTE — Telephone Encounter (Signed)
Patient is returning a call to Carolynn. °

## 2013-04-15 NOTE — Telephone Encounter (Signed)
LVM to advise patient to bring $35 copay with her to the [facility] appointment.

## 2013-04-21 ENCOUNTER — Ambulatory Visit (INDEPENDENT_AMBULATORY_CARE_PROVIDER_SITE_OTHER): Payer: BC Managed Care – PPO

## 2013-04-21 ENCOUNTER — Ambulatory Visit (INDEPENDENT_AMBULATORY_CARE_PROVIDER_SITE_OTHER): Payer: BC Managed Care – PPO | Admitting: Obstetrics and Gynecology

## 2013-04-21 ENCOUNTER — Encounter: Payer: Self-pay | Admitting: Obstetrics and Gynecology

## 2013-04-21 ENCOUNTER — Other Ambulatory Visit: Payer: Self-pay | Admitting: Obstetrics and Gynecology

## 2013-04-21 VITALS — BP 118/76 | HR 70 | Resp 16 | Ht 62.5 in | Wt 108.5 lb

## 2013-04-21 DIAGNOSIS — N950 Postmenopausal bleeding: Secondary | ICD-10-CM

## 2013-04-21 NOTE — Telephone Encounter (Signed)
Patient needs a return consult appointment with Dr. Edward Jolly (the week of christmas). Schedule frozen

## 2013-04-21 NOTE — Progress Notes (Signed)
Patient ID: Karen Copeland, female   DOB: [birth year], 52 y.o.   MRN: [id number]  Subjective  52 y.o. Married Asian female  G1P1001 with Patient's last menstrual period was 05/12/2010.  Patient is here for pelvic ultrasound, sonohysterogram and endometrial biopsy due to postmenopausal bleeding.   Patient states recently took an antiviral and had postmenopausal bleeding.  Bleeding started on 03/30/13 spotting for 3 days and then became heavy bleeding. This did not follow intercourse.  No pain.  Thinks she may have had some spotting one or more years ago.  No known fibroids.  Not on any hormone therapy.   Objective  Pelvic ultrasound performed showing 4.88 cm anterior fundal fibroid encroaching on the endometrium,  possible thickening of the endometrial at the fundal region. No adnexal masses or free fluid.  Images reviewed.  Procedure - sonohysterogram Consent performed. Speculum placed in vagina. Sterile prep of cervix with betadine. Cannula placed inside endometrial cavity without difficult. Speculum removed. Sterile saline injected. No filling defect noted.  Fibroid noted to encroach near the endometrium.  EMS 4.81 mm.  Cannula removed. No complication.   Procedure - endometrial biopsy Consent performed. Speculum place in vagina.  Sterile prep of cervix with betadine.  Pipelle placed to  8.5 cm without difficulty twice. Tissue obtained and sent to pathology. Speculum removed.  No complications. Minimal EBL.  Assessment  Postmenopausal bleeding. Uterine fibroid.  Plan  Follow up of EMB. Return for talking visit and planning. Precautions given.

## 2013-04-22 ENCOUNTER — Encounter: Payer: BC Managed Care – PPO | Admitting: Internal Medicine

## 2013-04-22 NOTE — Addendum Note (Signed)
Addended by: Annamaria Helling, BROOK E on: 04/22/2013 01:08 PM   Modules accepted: Orders

## 2013-04-27 NOTE — Telephone Encounter (Signed)
Please offer her Mon 05-02-13 at 1130. If she can do that I will enter it.

## 2013-04-27 NOTE — Telephone Encounter (Signed)
I left patient a message with appointment 05/02/13 @11 :30. I asked patient to call and confirm.

## 2013-04-29 NOTE — Progress Notes (Signed)
Pt. Notified of Biopsy results(neg for abnormal cells.)

## 2013-05-02 ENCOUNTER — Encounter: Payer: Self-pay | Admitting: Obstetrics and Gynecology

## 2013-05-02 ENCOUNTER — Ambulatory Visit (INDEPENDENT_AMBULATORY_CARE_PROVIDER_SITE_OTHER): Payer: BC Managed Care – PPO | Admitting: Obstetrics and Gynecology

## 2013-05-02 VITALS — BP 110/76 | HR 70 | Resp 16 | Ht 62.5 in | Wt 107.0 lb

## 2013-05-02 DIAGNOSIS — D219 Benign neoplasm of connective and other soft tissue, unspecified: Secondary | ICD-10-CM

## 2013-05-02 DIAGNOSIS — D259 Leiomyoma of uterus, unspecified: Secondary | ICD-10-CM

## 2013-05-02 NOTE — Patient Instructions (Signed)
Fibroids Fibroids are lumps (tumors) that can occur any place in a woman's body. These lumps are not cancerous. Fibroids vary in size, weight, and where they grow. HOME CARE  Do not take aspirin.  Write down the number of pads or tampons you use during your period. Tell your doctor. This can help determine the best treatment for you. GET HELP RIGHT AWAY IF:  You have pain in your lower belly (abdomen) that is not helped with medicine.  You have cramps that are not helped with medicine.  You have more bleeding between or during your period.  You feel lightheaded or pass out (faint).  Your lower belly pain gets worse. MAKE SURE YOU:  Understand these instructions.  Will watch your condition.  Will get help right away if you are not doing well or get worse. Document Released: 05/31/2010 Document Revised: 07/21/2011 Document Reviewed: 05/31/2010 [employer] Patient Information 2014 [employer].  

## 2013-05-02 NOTE — Progress Notes (Signed)
Patient ID: Karen Copeland, female   DOB: [birth year], 52 y.o.   MRN: [id number]  Subjective  Patient is here for follow up of postmenopausal bleeding.  Seen on 04/21/13 and had a pelvic ultrasound, sonohysterogram and endometrial biopsy.  Ultrasound showed 4.8 cm anterior fundal fibroid encroaching on the endometrium.  Normal right ovary.  Left ovary difficult to visualize, but no masses seen. No filling defects on the sonohysterogram.  Endometrial biopsy was negative  Constipation symptoms.  Seeing GI.  Will be having a colonoscopy.  Objective  BP 110/76     P 70  No exam.  Assessment  Episode of postmenopausal bleeding. Uterine fibroid encroaching on the endometrium.  New diagnosis of fibroid.   Plan  Discussion of fibroids in verbal and written form.  ACOG handout on fibroids. Return for recurrent postmenopausal bleeding. Follow up ultrasound in 6 months do document stability.   15 inutes face to face time of which over 50% was spent in counseling.

## 2013-05-10 ENCOUNTER — Telehealth: Payer: Self-pay | Admitting: Obstetrics and Gynecology

## 2013-05-10 NOTE — Telephone Encounter (Signed)
Lmtcb/called patient to schedule pus.//ssf

## 2013-11-14 ENCOUNTER — Ambulatory Visit: Payer: BC Managed Care – PPO | Admitting: Obstetrics and Gynecology

## 2013-12-08 ENCOUNTER — Ambulatory Visit (INDEPENDENT_AMBULATORY_CARE_PROVIDER_SITE_OTHER): Payer: BC Managed Care – PPO

## 2013-12-08 ENCOUNTER — Encounter: Payer: Self-pay | Admitting: Obstetrics and Gynecology

## 2013-12-08 ENCOUNTER — Ambulatory Visit (INDEPENDENT_AMBULATORY_CARE_PROVIDER_SITE_OTHER): Payer: BC Managed Care – PPO | Admitting: Obstetrics and Gynecology

## 2013-12-08 VITALS — BP 104/64 | HR 62 | Resp 20 | Ht 62.5 in | Wt 108.0 lb

## 2013-12-08 DIAGNOSIS — Z1211 Encounter for screening for malignant neoplasm of colon: Secondary | ICD-10-CM

## 2013-12-08 DIAGNOSIS — K5900 Constipation, unspecified: Secondary | ICD-10-CM

## 2013-12-08 DIAGNOSIS — D219 Benign neoplasm of connective and other soft tissue, unspecified: Secondary | ICD-10-CM | POA: Insufficient documentation

## 2013-12-08 DIAGNOSIS — D259 Leiomyoma of uterus, unspecified: Secondary | ICD-10-CM

## 2013-12-08 DIAGNOSIS — D251 Intramural leiomyoma of uterus: Secondary | ICD-10-CM

## 2013-12-08 NOTE — Progress Notes (Signed)
  Subjective  52 year old G1P1 Patient her for pelvic ultrasound today to recheck fibroid.  No spotting.   Had increase in constipation.  Worried about this.  Stool is more dry.  TSH normal in August 2014. Did not have colonoscopy due to school schedule an travel plans.  Just got back form [place].   Objective  Pelvic ultrasound - images and report reviewed with patient.   Uterus with fundal fibroid 4.8 cm, encroaching on the endometrium.  EMS 3.52 mm.  Ovaries normal. No free fluid.      Assessment  Stable uterine fibroid.  Constipations.   Plan  Follow fibroid symtomatically.  OK for periodic ultrasound.  Referral back to GI to complete colonoscopy and evaluation for constipation.  Annual exam in October to reassess constipation and fibroid symptoms.   15 minutes face to face time of which over 50% was spent in counseling.   After visit summary to patient.

## 2014-01-03 ENCOUNTER — Telehealth: Payer: Self-pay | Admitting: Obstetrics and Gynecology

## 2014-01-03 ENCOUNTER — Encounter: Payer: Self-pay | Admitting: Internal Medicine

## 2014-01-03 NOTE — Telephone Encounter (Signed)
Patient mailbox is full. Need to advise that she is scheduled with Dr Germain Osgood, Gastroenterologist, on Sept 18 @ 1530.

## 2014-01-06 ENCOUNTER — Ambulatory Visit: Payer: BC Managed Care – PPO | Admitting: Obstetrics and Gynecology

## 2014-01-09 ENCOUNTER — Encounter: Payer: Self-pay | Admitting: Internal Medicine

## 2014-01-09 NOTE — Telephone Encounter (Signed)
Pt would like to speak with nurse regarding her referring dr's appt.

## 2014-01-09 NOTE — Telephone Encounter (Signed)
Spoke with patient. Patient states that she received appointment information in the mail. States that she was seen with Dr.Broadie's nurse already for a consult appointment but could not schedule for procedure due to insurance. Was to call back to reschedule appointment. Patient is requesting that we call to reschedule this with Dr.Broadie. Patient will check schedule and call office back to let us know what day she can be seen. Advised would go from there with speaking with their office to see what needs to be done with scheduling for patient from here. She is agreeable.

## 2014-01-09 NOTE — Telephone Encounter (Signed)
Spoke with patient. Patient states that she is available for appointment for Oct 12th. Advised would call over to the office to see what we need to do as far as scheduling and give patient a call back. Patient is agreeable. Spoke with [facility]. Patient will have to be seen for another consult before proceeding with scheduling procedure. Appointment changed to Oct 13th at 2:45pm as this is the first available with Dr.Broadie. Tried to reach patient at number provided. Voicemail box is full and unable to leave message to advised of appointment change. Will try again later.

## 2014-01-10 NOTE — Telephone Encounter (Signed)
Spoke with patient. Advised got patient rescheduled for first available with Dr.Broadie for consult on Oct 13th at 2:45pm. Patient agreeable to date and time.  Routing to provider for final review. Patient agreeable to disposition. Will close encounter

## 2014-01-17 ENCOUNTER — Encounter: Payer: Self-pay | Admitting: Obstetrics and Gynecology

## 2014-01-27 ENCOUNTER — Ambulatory Visit: Payer: BC Managed Care – PPO | Admitting: Internal Medicine

## 2014-02-21 ENCOUNTER — Ambulatory Visit (INDEPENDENT_AMBULATORY_CARE_PROVIDER_SITE_OTHER): Payer: BC Managed Care – PPO | Admitting: Internal Medicine

## 2014-02-21 ENCOUNTER — Encounter: Payer: Self-pay | Admitting: Internal Medicine

## 2014-02-21 VITALS — BP 100/70 | HR 64 | Ht 62.0 in | Wt 107.0 lb

## 2014-02-21 DIAGNOSIS — K5901 Slow transit constipation: Secondary | ICD-10-CM

## 2014-02-21 NOTE — Progress Notes (Signed)
Karen Copeland 09-19-1960 [id number]  Note: This dictation was prepared with Dragon digital system. Any transcriptional errors that result from this procedure are unintentional.   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 She has developed constipation. She has sedentary work and she has been under stress. She has been trying to eat a lot of fiber and drink water but her bowel movements occur about once a week. She denies rectal bleeding or abdominal pain. She was scheduled for colonoscopy one year ago but her insurance did not cover as much as she expected. There is no family history of colon cancer. She does not exercise. Her stools are hard and pellet like. She has to strain a lot to have a bowel movement.    Past Medical History  Diagnosis Date  . Allergy   . H/O melasma   . Fibroid     Past Surgical History  Procedure Laterality Date  . No prior surgery      Allergies  Allergen Reactions  . Hydrocortisone Rash    Pt. States this was years ago--    Family history and social history have been reviewed.  Review of Systems: Denies abdominal pain nausea vomiting, weight loss  The remainder of the 10 point ROS is negative except as outlined in the H&P  Physical Exam: General Appearance thin, in no distress Eyes  Non icteric  HEENT  Non traumatic, normocephalic  Mouth No lesion, tongue papillated, no cheilosis Neck Supple without adenopathy, thyroid not enlarged, no carotid bruits, no JVD Lungs Clear to auscultation bilaterally COR Normal S1, normal S2, regular rhythm, no murmur, quiet precordium Abdomen scaphoid soft nontender with palpable sigmoid colon. Right middle quadrant normal. Liver edge at costal margin Rectal small amount of formed Hemoccult-negative stool Extremities  No pedal edema Skin No lesions Neurological Alert and oriented x 3 Psychological Normal mood and affect  Assessment and Plan:    Problem #81 53 year old Asian female with slow transit constipation which is a multifactorial problem due to sedentary life as well as stress and decreased activity. We have discussed increasing her exercise as well as her fiber intake. She will start Benefiber 1 tablespoon mixed with yogurt in the morning. She can also take milk of magnesia 15-30 cc 3 times a week. She is a good candidate for a screening colonoscopy which will be scheduled after the new year at her request.    Delfin Edis 02/21/2014

## 2014-02-21 NOTE — Patient Instructions (Addendum)
You will be due for a recall colonoscopy in 05/2014. We will send you a reminder in the mail when it gets closer to that time.  Please purchase the following medications over the counter and take as directed: Milk of Magnesia-Take 1 capful three times weekly  High-Fiber Diet Fiber is found in fruits, vegetables, and grains. A high-fiber diet encourages the addition of more whole grains, legumes, fruits, and vegetables in your diet. The recommended amount of fiber for adult males is 38 g per day. For adult females, it is 25 g per day. Pregnant and lactating women should get 28 g of fiber per day. If you have a digestive or bowel problem, ask your caregiver for advice before adding high-fiber foods to your diet. Eat a variety of high-fiber foods instead of only a select few type of foods.  PURPOSE  To increase stool bulk.  To make bowel movements more regular to prevent constipation.  To lower cholesterol.  To prevent overeating. WHEN IS THIS DIET USED?  It may be used if you have constipation and hemorrhoids.  It may be used if you have uncomplicated diverticulosis (intestine condition) and irritable bowel syndrome.  It may be used if you need help with weight management.  It may be used if you want to add it to your diet as a protective measure against atherosclerosis, diabetes, and cancer. SOURCES OF FIBER  Whole-grain breads and cereals.  Fruits, such as apples, oranges, bananas, berries, prunes, and pears.  Vegetables, such as green peas, carrots, sweet potatoes, beets, broccoli, cabbage, spinach, and artichokes.  Legumes, such split peas, soy, lentils.  Almonds. FIBER CONTENT IN FOODS Starches and Grains / Dietary Fiber (g)  Cheerios, 1 cup / 3 g  Corn Flakes cereal, 1 cup / 0.7 g  Rice crispy treat cereal, 1 cup / 0.3 g  Instant oatmeal (cooked),  cup / 2 g  Frosted wheat cereal, 1 cup / 5.1 g  Brown, long-grain rice (cooked), 1 cup / 3.5 g  White, long-grain  rice (cooked), 1 cup / 0.6 g  Enriched macaroni (cooked), 1 cup / 2.5 g Legumes / Dietary Fiber (g)  Baked beans (canned, plain, or vegetarian),  cup / 5.2 g  Kidney beans (canned),  cup / 6.8 g  Pinto beans (cooked),  cup / 5.5 g Breads and Crackers / Dietary Fiber (g)  Plain or honey graham crackers, 2 squares / 0.7 g  Saltine crackers, 3 squares / 0.3 g  Plain, salted pretzels, 10 pieces / 1.8 g  Whole-wheat bread, 1 slice / 1.9 g  White bread, 1 slice / 0.7 g  Raisin bread, 1 slice / 1.2 g  Plain bagel, 3 oz / 2 g  Flour tortilla, 1 oz / 0.9 g  Corn tortilla, 1 small / 1.5 g  Hamburger or hotdog bun, 1 small / 0.9 g Fruits / Dietary Fiber (g)  Apple with skin, 1 medium / 4.4 g  Sweetened applesauce,  cup / 1.5 g  Banana,  medium / 1.5 g  Grapes, 10 grapes / 0.4 g  Orange, 1 small / 2.3 g  Raisin, 1.5 oz / 1.6 g  Melon, 1 cup / 1.4 g Vegetables / Dietary Fiber (g)  Green beans (canned),  cup / 1.3 g  Carrots (cooked),  cup / 2.3 g  Broccoli (cooked),  cup / 2.8 g  Peas (cooked),  cup / 4.4 g  Mashed potatoes,  cup / 1.6 g  Lettuce, 1 cup /  0.5 g  Corn (canned),  cup / 1.6 g  Tomato,  cup / 1.1 g Document Released: 04/28/2005 Document Revised: 10/28/2011 Document Reviewed: 07/31/2011 [facility] Patient Information 2015 [place], [place]. This information is not intended to replace advice given to you by your health care provider. Make sure you discuss any questions you have with your health care provider.  Dr Sharen Counter

## 2014-03-13 ENCOUNTER — Encounter: Payer: Self-pay | Admitting: Internal Medicine

## 2014-03-24 ENCOUNTER — Encounter: Payer: Self-pay | Admitting: Obstetrics and Gynecology

## 2014-03-24 ENCOUNTER — Ambulatory Visit (INDEPENDENT_AMBULATORY_CARE_PROVIDER_SITE_OTHER): Payer: BC Managed Care – PPO | Admitting: Obstetrics and Gynecology

## 2014-03-24 ENCOUNTER — Ambulatory Visit: Payer: BC Managed Care – PPO | Admitting: Obstetrics and Gynecology

## 2014-03-24 VITALS — BP 110/76 | HR 84 | Resp 16 | Ht 62.5 in | Wt 107.0 lb

## 2014-03-24 DIAGNOSIS — K5900 Constipation, unspecified: Secondary | ICD-10-CM

## 2014-03-24 DIAGNOSIS — R8290 Unspecified abnormal findings in urine: Secondary | ICD-10-CM

## 2014-03-24 DIAGNOSIS — Z0000 Encounter for general adult medical examination without abnormal findings: Secondary | ICD-10-CM

## 2014-03-24 DIAGNOSIS — Z01419 Encounter for gynecological examination (general) (routine) without abnormal findings: Secondary | ICD-10-CM

## 2014-03-24 LAB — THYROID PANEL WITH TSH
Free Thyroxine Index: 2 (ref 1.4–3.8)
T3 UPTAKE: 26 % (ref 22–35)
T4, Total: 7.5 ug/dL (ref 4.5–12.0)
TSH: 2.845 u[IU]/mL (ref 0.350–4.500)

## 2014-03-24 LAB — POCT URINALYSIS DIPSTICK
BILIRUBIN UA: NEGATIVE
Glucose, UA: NEGATIVE
Ketones, UA: NEGATIVE
NITRITE UA: NEGATIVE
PH UA: 7
Protein, UA: NEGATIVE
Urobilinogen, UA: NEGATIVE

## 2014-03-24 NOTE — Patient Instructions (Signed)

## 2014-03-24 NOTE — Progress Notes (Signed)
53 y.o. G1P1001 MarriedAsianF here for annual exam.    Has 5 cm fundal fibroid by ultrasound.  No vaginal bleeding.   Saw Dr. Olevia Perches about constipation.  Planning on colonoscopy next year.  Dietary changes improve constipation.  Apples help constipation.  TSH was normal last year.  Sees a dermatologist for melasma.  Off OCPs for 4 - 5 years now.   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   Patient's last menstrual period was 05/12/2010.          Sexually active: Yes.    The current method of family planning is post menopausal status.    Exercising: Yes.    Walking 1 - 2 miles per week Smoker:  no  Health Maintenance: Pap:  12/2012 Neg. HR HPV: neg History of abnormal Pap:  no MMG:  02/2013 BIRADS1:Neg.  Extremely dense breast tissue.  Colonoscopy:  N/A BMD:   N/A TDaP:  2014 Screening Labs: [place], Hb today: Dianna Rossetti, husbands work place.  Urine today: WHQ:PRFFMBWG, YKZ:LDJTT - no symptoms.  Voids often in the am after coffee.    reports that she has never smoked. She has never used smokeless tobacco. She reports that she does not drink alcohol or use illicit drugs.  Past Medical History  Diagnosis Date  . Allergy   . H/O melasma   . Fibroid     Past Surgical History  Procedure Laterality Date  . No prior surgery      Current Outpatient Prescriptions  Medication Sig Dispense Refill  . Multiple Vitamin (MULTIVITAMIN) capsule Take 1 capsule by mouth daily.    . Neo-Fluocinolone & Emollient (NEO-SYNALAR) 0.35-0.025 % KIT      No current facility-administered medications for this visit.    Family History  Problem Relation Age of Onset  . Ovarian cancer Mother     ?poss. ovarian--unsure  . Diabetes Brother     as a child--but not as an adult  . Colon cancer Neg Hx     ROS:  Pertinent items are noted in HPI.  Otherwise, a comprehensive ROS was negative.  Exam:   BP 110/76 mmHg  Pulse 84  Resp 16  Ht 5' 2.5" (1.588 m)  Wt 107 lb (48.535 kg)  BMI 19.25  kg/m2  LMP 05/12/2010      Height: 5' 2.5" (158.8 cm)  Ht Readings from Last 3 Encounters:  03/24/14 5' 2.5" (1.588 m)  02/21/14 $RemoveB'5\' 2"'PZBTJtRf$  (1.575 m)  12/08/13 5' 2.5" (1.588 m)    General appearance: alert, cooperative and appears stated age Head: Normocephalic, without obvious abnormality, atraumatic Neck: no adenopathy, supple, symmetrical, trachea midline and thyroid normal to inspection and palpation Lungs: clear to auscultation bilaterally Breasts: normal appearance, no masses or tenderness, Inspection negative, No nipple retraction or dimpling, No nipple discharge or bleeding, No axillary or supraclavicular adenopathy Heart: regular rate and rhythm Abdomen: soft, non-tender; bowel sounds normal; no masses,  no organomegaly Extremities: extremities normal, atraumatic, no cyanosis or edema Skin: Skin color, texture, turgor normal. No rashes or lesions Lymph nodes: Cervical, supraclavicular, and axillary nodes normal. No abnormal inguinal nodes palpated Neurologic: Grossly normal   Pelvic: External genitalia:  no lesions              Urethra:  normal appearing urethra with no masses, tenderness or lesions              Bartholins and Skenes: normal  Vagina: normal appearing vagina with normal color and discharge, no lesions              Cervix: no lesions              Pap taken: No. Bimanual Exam:  Uterus:  enlarged,  6 weeks size Posterior fundal fibroid is subtle.               Adnexa: normal adnexa and no mass, fullness, tenderness               Rectovaginal: Confirms               Anus:  normal sphincter tone, no lesions  A:  Well Woman with normal exam Known fundal fibroid. Positive urine dip. Chronic constipation.  Melasma.  P:   Mammogram due.  Patient will schedule 3D. pap smear not indicated.  Urine micro and culture. TFTs.   Follow up with GI.  Other labs through husband's work. return annually or prn  An After Visit Summary was printed and given  to the patient.

## 2014-03-25 LAB — URINE CULTURE
COLONY COUNT: NO GROWTH
Organism ID, Bacteria: NO GROWTH

## 2014-03-25 LAB — URINALYSIS, MICROSCOPIC ONLY
BACTERIA UA: NONE SEEN
Casts: NONE SEEN
Crystals: NONE SEEN
Squamous Epithelial / LPF: NONE SEEN

## 2014-05-27 ENCOUNTER — Encounter: Payer: Self-pay | Admitting: Internal Medicine

## 2015-02-23 ENCOUNTER — Ambulatory Visit (INDEPENDENT_AMBULATORY_CARE_PROVIDER_SITE_OTHER): Payer: BC Managed Care – PPO

## 2015-02-23 ENCOUNTER — Ambulatory Visit (INDEPENDENT_AMBULATORY_CARE_PROVIDER_SITE_OTHER): Payer: BC Managed Care – PPO | Admitting: Family Medicine

## 2015-02-23 ENCOUNTER — Encounter: Payer: Self-pay | Admitting: Family Medicine

## 2015-02-23 VITALS — BP 133/80 | HR 65 | Wt 107.0 lb

## 2015-02-23 DIAGNOSIS — M7502 Adhesive capsulitis of left shoulder: Secondary | ICD-10-CM

## 2015-02-23 DIAGNOSIS — M7500 Adhesive capsulitis of unspecified shoulder: Secondary | ICD-10-CM | POA: Insufficient documentation

## 2015-02-23 DIAGNOSIS — M25512 Pain in left shoulder: Secondary | ICD-10-CM

## 2015-02-23 DIAGNOSIS — M8580 Other specified disorders of bone density and structure, unspecified site: Secondary | ICD-10-CM

## 2015-02-23 NOTE — Progress Notes (Signed)
Quick Note:  Xray is normal. ______

## 2015-02-23 NOTE — Patient Instructions (Addendum)
Thank you for coming in today. Get an x-ray and labs today. Attend physical therapy. Do home exercises. Return in 3 weeks.  Adhesive Capsulitis Adhesive capsulitis is inflammation of the tendons and ligaments that surround the shoulder joint (shoulder capsule). This condition causes the shoulder to become stiff and painful to move. Adhesive capsulitis is also called frozen shoulder. CAUSES This condition may be caused by:  An injury to the shoulder joint.  Straining the shoulder.  Not moving the shoulder for a period of time. This can happen if your arm was injured or in a sling.  Long-standing health problems, such as:  Diabetes.  Thyroid problems.  Heart disease.  Stroke.  Rheumatoid arthritis.  Lung disease. In some cases, the cause may not be known. RISK FACTORS This condition is more likely to develop in:  Women.  People who are older than 55 years of age. SYMPTOMS Symptoms of this condition include:  Pain in the shoulder when moving the arm. There may also be pain when parts of the shoulder are touched. The pain is worse at night or when at rest.  Soreness or aching in the shoulder.  Inability to move the shoulder normally.  Muscle spasms. DIAGNOSIS This condition is diagnosed with a physical exam and imaging tests, such as an X-ray or MRI. TREATMENT This condition may be treated with:  Treatment of the underlying cause or condition.  Physical therapy. This involves performing exercises to get the shoulder moving again.  Medicine. Medicine may be given to relieve pain, inflammation, or muscle spasms.  Steroid injections into the shoulder joint.  Shoulder manipulation. This is a procedure to move the shoulder into another position. It is done after you are given a medicine to make you fall asleep (general anesthetic). The joint may also be injected with salt water at high pressure to break down scarring.  Surgery. This may be done in severe cases  when other treatments have failed. Although most people recover completely from adhesive capsulitis, some may not regain the full movement of the shoulder. HOME CARE INSTRUCTIONS  Take over-the-counter and prescription medicines only as told by your health care provider.  If you are being treated with physical therapy, follow instructions from your physical therapist.  Avoid exercises that put a lot of demand on your shoulder, such as throwing. These exercises can make pain worse.  If directed, apply ice to the injured area:  Put ice in a plastic bag.  Place a towel between your skin and the bag.  Leave the ice on for 20 minutes, 2-3 times per day. SEEK MEDICAL CARE IF:  You develop new symptoms.  Your symptoms get worse.   This information is not intended to replace advice given to you by your health care provider. Make sure you discuss any questions you have with your health care provider.   Document Released: 02/23/2009 Document Revised: 01/17/2015 Document Reviewed: 08/21/2014 Elsevier Interactive Patient Education [employer].

## 2015-02-23 NOTE — Assessment & Plan Note (Signed)
History and exam consistent with adhesive capsulitis. Plan to check x-ray. Start physical therapy and home exercises. Return in a few weeks. That time if not better would consider large volume glenohumeral steroid injection.

## 2015-02-23 NOTE — Progress Notes (Signed)
   Subjective:    I'm seeing this patient as a consultation for:  Iran Planas PA-C  CC: Left shoulder pain  HPI: Patient notes a one-month history of insidious onset left shoulder pain. She denies any injury. Pain is diffuse felt in the trapezius posterior shoulder and lateral upper arm. She denies any significant radiating beyond the elbow. No weakness or numbness fevers chills nausea vomiting or diarrhea. She's tried some over-the-counter medications and NSAIDs which helped only a little. She works as a Acupuncturist at [employer]. She notes that she recently had a glucose check at her husband's work a month or so ago and was reportedly normal. She denies any personal history of diabetes.  Past medical history, Surgical history, Family history not pertinant except as noted below, Social history, Allergies, and medications have been entered into the medical record, reviewed, and no changes needed.   Review of Systems: No headache, visual changes, nausea, vomiting, diarrhea, constipation, dizziness, abdominal pain, skin rash, fevers, chills, night sweats, weight loss, swollen lymph nodes, body aches, joint swelling, muscle aches, chest pain, shortness of breath, mood changes, visual or auditory hallucinations.   Objective:    Filed Vitals:   02/23/15 0936  BP: 133/80  Pulse: 65   General: Well Developed, well nourished, and in no acute distress.  Neuro/Psych: Alert and oriented x3, extra-ocular muscles intact, able to move all 4 extremities, sensation grossly intact. Skin: Warm and dry, no rashes noted.  Respiratory: Not using accessory muscles, speaking in full sentences, trachea midline.  Cardiovascular: Pulses palpable, no extremity edema. Abdomen: Does not appear distended. MSK: Neck: Nontender to midline normal neck range of motion negative Spurling's test. Shoulder normal-appearing nontender. Range of motion: External rotation limited to 25 beyond  neutral position. Active and passive Abduction limited to 75 active and passive. Forward flexion limited to 120 active and passive.   internal rotation to the lumbar area Strength is intact within limits of motion. Negative impingement testing. Reflexes are intact and equal bilateral upper extremities. Grip strength is intact and equal bilateral extremities. Sensation is intact throughout.  Left shoulder glenohumeral x-ray pending.  No results found for this or any previous visit (from the past 24 hour(s)). No results found.  Impression and Recommendations:   This case required medical decision making of moderate complexity.

## 2015-10-23 ENCOUNTER — Ambulatory Visit (INDEPENDENT_AMBULATORY_CARE_PROVIDER_SITE_OTHER): Payer: BC Managed Care – PPO | Admitting: Osteopathic Medicine

## 2015-10-23 ENCOUNTER — Emergency Department
Admission: EM | Admit: 2015-10-23 | Discharge: 2015-10-23 | Disposition: A | Discharge status: Home / Self Care | Payer: Self-pay | Source: Home / Self Care

## 2015-10-23 VITALS — BP 127/79 | HR 70 | Wt 106.0 lb

## 2015-10-23 DIAGNOSIS — J029 Acute pharyngitis, unspecified: Secondary | ICD-10-CM | POA: Diagnosis not present

## 2015-10-23 DIAGNOSIS — J069 Acute upper respiratory infection, unspecified: Secondary | ICD-10-CM | POA: Diagnosis not present

## 2015-10-23 DIAGNOSIS — Z111 Encounter for screening for respiratory tuberculosis: Secondary | ICD-10-CM

## 2015-10-23 MED ORDER — LIDOCAINE VISCOUS 2 % MT SOLN: 15.0000 mL | OROMUCOSAL | Status: DC | PRN

## 2015-10-23 MED ORDER — TUBERCULIN PPD 5 UNIT/0.1ML ID SOLN
5.0000 [IU] | Freq: Once | INTRADERMAL | Status: DC
  Administered 2015-10-23: 5 [IU] via INTRADERMAL

## 2015-10-23 MED ORDER — IPRATROPIUM BROMIDE 0.03 % NA SOLN: 2.0000 | Freq: Three times a day (TID) | NASAL | Status: DC | PRN

## 2015-10-23 NOTE — Progress Notes (Signed)
HPI: Karen Copeland is a 55 y.o. female who presents to [facility]  today for chief complaint of:  Chief Complaint  Patient presents with  . Sore Throat  . Nasal Congestion    . Quality: congestion  . Assoc signs/symptoms: see ROS, sore throat  . Duration: 1 days . Modifying factors: has tried the following OTC/Rx medications: has taken acetaminophen this morning     Past medical, social and family history reviewed. Current medications and allergies reviewed.     Review of Systems: CONSTITUTIONAL: no fever/chills HEAD/EYES/EARS/NOSE/THROAT: yes headache, no vision change or hearing change, yes sore throat, yes sneezing CARDIAC: No chest pain, No pressure/palpitations RESPIRATORY: no cough, no shortness of breath GASTROINTESTINAL: no nausea, no vomiting, no abdominal pain, no diarrhea MUSCULOSKELETAL: no myalgia/arthralgia SKIN: no rash   Exam:  BP 127/79 mmHg  Pulse 70  Wt 106 lb (48.081 kg)  LMP 05/12/2010 Constitutional: VSS, see above. General Appearance: alert, well-developed, well-nourished, NAD Eyes: Normal lids and conjunctive, non-icteric sclera, PERRLA Ears, Nose, Mouth, Throat: Normal external inspection ears/nares/mouth/lips/gums, normal TM, MMM;       posterior pharynx without erythema, without exudate, nasal mucosa normal Neck: No masses, trachea midline. normal lymph nodes Respiratory: Normal respiratory effort. No  wheeze/rhonchi/rales Cardiovascular: S1/S2 normal, no murmur/rub/gallop auscultated. RRR.   No results found for this or any previous visit (from the past 72 hour(s)). No results found.   ASSESSMENT/PLAN: Likely viral - if she is concerned about TB she can get on nurse schedule when feeling better for TB skin test or can ask insurance if quantiferon is covered. Symptomatic care for viral URI given one day of symptoms and low concern for strep (Centor = 0).   Viral URI - Plan: ipratropium (ATROVENT)  0.03 % nasal spray  Sore throat - Plan: lidocaine (XYLOCAINE) 2 % solution    Visit summary was printed for the patient with medications and pertinent instructions for patient to review. ER/RTC precautions reviewed. All questions answered. Return if symptoms worsen or fail to improve.

## 2015-10-23 NOTE — Patient Instructions (Signed)
OK to continue antihistamine, can add decongestant such as Sudafed. OK to continue acetaminophen. See prescriptions as below for nasal spray to help runny nose and congestion, and oral lidocaine to help with sore throat.   Upper Respiratory Infection, Adult Most upper respiratory infections (URIs) are a viral infection of the air passages leading to the lungs. A URI affects the nose, throat, and upper air passages. The most common type of URI is nasopharyngitis and is typically referred to as "the common cold." URIs run their course and usually go away on their own. Most of the time, a URI does not require medical attention, but sometimes a bacterial infection in the upper airways can follow a viral infection. This is called a secondary infection. Sinus and middle ear infections are common types of secondary upper respiratory infections. Bacterial pneumonia can also complicate a URI. A URI can worsen asthma and chronic obstructive pulmonary disease (COPD). Sometimes, these complications can require emergency medical care and may be life threatening.  CAUSES Almost all URIs are caused by viruses. A virus is a type of germ and can spread from one person to another.  RISKS FACTORS You may be at risk for a URI if:   You smoke.   You have chronic heart or lung disease.  You have a weakened defense (immune) system.   You are very young or very old.   You have nasal allergies or asthma.  You work in crowded or poorly ventilated areas.  You work in health care facilities or schools. SIGNS AND SYMPTOMS  Symptoms typically develop 2-3 days after you come in contact with a cold virus. Most viral URIs last 7-10 days. However, viral URIs from the influenza virus (flu virus) can last 14-18 days and are typically more severe. Symptoms may include:   Runny or stuffy (congested) nose.   Sneezing.   Cough.   Sore throat.   Headache.   Fatigue.   Fever.   Loss of appetite.   Pain  in your forehead, behind your eyes, and over your cheekbones (sinus pain).  Muscle aches.  DIAGNOSIS  Your health care provider may diagnose a URI by:  Physical exam.  Tests to check that your symptoms are not due to another condition such as:  Strep throat.  Sinusitis.  Pneumonia.  Asthma. TREATMENT  A URI goes away on its own with time. It cannot be cured with medicines, but medicines may be prescribed or recommended to relieve symptoms. Medicines may help:  Reduce your fever.  Reduce your cough.  Relieve nasal congestion. HOME CARE INSTRUCTIONS   Take medicines only as directed by your health care provider.   Gargle warm saltwater or take cough drops to comfort your throat as directed by your health care provider.  Use a warm mist humidifier or inhale steam from a shower to increase air moisture. This may make it easier to breathe.  Drink enough fluid to keep your urine clear or pale yellow.   Eat soups and other clear broths and maintain good nutrition.   Rest as needed.   Return to work when your temperature has returned to normal or as your health care provider advises. You may need to stay home longer to avoid infecting others. You can also use a face mask and careful hand washing to prevent spread of the virus.  Increase the usage of your inhaler if you have asthma.   Do not use any tobacco products, including cigarettes, chewing tobacco, or electronic cigarettes. If  you need help quitting, ask your health care provider. PREVENTION  The best way to protect yourself from getting a cold is to practice good hygiene.   Avoid oral or hand contact with people with cold symptoms.   Wash your hands often if contact occurs.  There is no clear evidence that vitamin C, vitamin E, echinacea, or exercise reduces the chance of developing a cold. However, it is always recommended to get plenty of rest, exercise, and practice good nutrition.  SEEK MEDICAL CARE IF:     You are getting worse rather than better.   Your symptoms are not controlled by medicine.   You have chills.  You have worsening shortness of breath.  You have brown or red mucus.  You have yellow or brown nasal discharge.  You have pain in your face, especially when you bend forward.  You have a fever.  You have swollen neck glands.  You have pain while swallowing.  You have white areas in the back of your throat. SEEK IMMEDIATE MEDICAL CARE IF:   You have severe or persistent:  Headache.  Ear pain.  Sinus pain.  Chest pain.  You have chronic lung disease and any of the following:  Wheezing.  Prolonged cough.  Coughing up blood.  A change in your usual mucus.  You have a stiff neck.  You have changes in your:  Vision.  Hearing.  Thinking.  Mood. MAKE SURE YOU:   Understand these instructions.  Will watch your condition.  Will get help right away if you are not doing well or get worse.   This information is not intended to replace advice given to you by your health care provider. Make sure you discuss any questions you have with your health care provider.   Document Released: 10/22/2000 Document Revised: 09/12/2014 Document Reviewed: 08/03/2013 Elsevier Interactive Patient Education [employer].

## 2015-10-23 NOTE — ED Notes (Signed)
Karen Copeland is here today for a PPD placement.

## 2015-10-25 ENCOUNTER — Encounter: Payer: Self-pay | Admitting: Emergency Medicine

## 2015-10-25 ENCOUNTER — Emergency Department
Admission: EM | Admit: 2015-10-25 | Discharge: 2015-10-25 | Disposition: A | Discharge status: Home / Self Care | Payer: BC Managed Care – PPO | Source: Home / Self Care

## 2015-10-25 DIAGNOSIS — Z299 Encounter for prophylactic measures, unspecified: Secondary | ICD-10-CM

## 2015-10-25 LAB — READ PPD: TB SKIN TEST: NEGATIVE

## 2015-10-25 NOTE — ED Notes (Signed)
PPD read

## 2015-12-28 ENCOUNTER — Ambulatory Visit: Payer: BC Managed Care – PPO | Admitting: Obstetrics and Gynecology

## 2016-02-18 ENCOUNTER — Encounter: Payer: Self-pay | Admitting: Obstetrics and Gynecology

## 2016-02-18 ENCOUNTER — Ambulatory Visit (INDEPENDENT_AMBULATORY_CARE_PROVIDER_SITE_OTHER): Payer: BC Managed Care – PPO | Admitting: Obstetrics and Gynecology

## 2016-02-18 ENCOUNTER — Other Ambulatory Visit: Payer: Self-pay | Admitting: Obstetrics and Gynecology

## 2016-02-18 VITALS — BP 92/62 | HR 70 | Resp 16 | Ht 62.25 in | Wt 107.0 lb

## 2016-02-18 DIAGNOSIS — Z01419 Encounter for gynecological examination (general) (routine) without abnormal findings: Secondary | ICD-10-CM | POA: Diagnosis not present

## 2016-02-18 DIAGNOSIS — Z1231 Encounter for screening mammogram for malignant neoplasm of breast: Secondary | ICD-10-CM

## 2016-02-18 DIAGNOSIS — Z1211 Encounter for screening for malignant neoplasm of colon: Secondary | ICD-10-CM

## 2016-02-18 DIAGNOSIS — K5900 Constipation, unspecified: Secondary | ICD-10-CM | POA: Diagnosis not present

## 2016-02-18 DIAGNOSIS — L811 Chloasma: Secondary | ICD-10-CM | POA: Diagnosis not present

## 2016-02-18 DIAGNOSIS — Z0000 Encounter for general adult medical examination without abnormal findings: Secondary | ICD-10-CM | POA: Diagnosis not present

## 2016-02-18 LAB — POCT URINALYSIS DIPSTICK
BILIRUBIN UA: NEGATIVE
GLUCOSE UA: NEGATIVE
Ketones, UA: NEGATIVE
Leukocytes, UA: NEGATIVE
NITRITE UA: NEGATIVE
PH UA: 5
Protein, UA: NEGATIVE
Urobilinogen, UA: NEGATIVE

## 2016-02-18 NOTE — Progress Notes (Signed)
RN helped patient to schedule screening MMG with patient request for 3D. The only available appointment [facility] had on 10/10 was at 1620 and the patient declined that appointment. Patient requesting to schedule the first week of December. Appointment scheduled for Monday 04/14/16 at 0830. Patient in office at the time appointment was scheduled and is agreeable to date and time.

## 2016-02-18 NOTE — Progress Notes (Signed)
55 y.o. G22P1001 Married [place] female here for annual exam.    Having constipation issues which are chronic. States her whole family has this. Saw Dr. Olevia Perches in past. Miralax did not help. Does dietary change to try to control this.  Having a BM every day.   Has cloasma.  Sees dermatologist.  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   PCP: [place] Planas     Patient's last menstrual period was 04/23/2011.           Sexually active: Yes.    The current method of family planning is post menopausal status.    Exercising: Yes.    walking & stretching Smoker:  no  Health Maintenance: Pap:  12-31-12 neg HPV HR neg History of abnormal Pap:  no MMG:  02/21/13 category d density birads 1:neg Colonoscopy: n/a BMD:   n/a  Result  n/a TDaP:  2014 HIV: not done Hep C: not done Screening Labs:  Hb today: 12.8, Urine today: rbc tr Self breast exam: done occ   reports that she has never smoked. She has never used smokeless tobacco. She reports that she does not drink alcohol or use drugs.  Past Medical History:  Diagnosis Date  . Allergy   . Fibroid   . H/O melasma     Past Surgical History:  Procedure Laterality Date  . no prior surgery      Current Outpatient Prescriptions  Medication Sig Dispense Refill  . Methylsulfonylmethane (MSM PO) Take by mouth daily.    . Multiple Vitamin (MULTIVITAMIN) capsule Take 1 capsule by mouth daily.    Marland Kitchen UNABLE TO FIND daily. probiotics     No current facility-administered medications for this visit.     Family History  Problem Relation Age of Onset  . Ovarian cancer Mother     ?poss. ovarian--unsure  . Diabetes Brother     as a child--but not as an adult  . Colon cancer Neg Hx     ROS:  Pertinent items are noted in HPI.  Otherwise, a comprehensive ROS was negative.  Exam:   BP 92/62   Pulse 70   Resp 16   Ht 5' 2.25" (1.581 m)   Wt 107 lb (48.5 kg)   LMP 04/23/2011   BMI 19.41 kg/m     General appearance: alert, cooperative  and appears stated age Head: Normocephalic, without obvious abnormality, atraumatic Neck: no adenopathy, supple, symmetrical, trachea midline and thyroid normal to inspection and palpation Lungs: clear to auscultation bilaterally Breasts: normal appearance, no masses or tenderness, No nipple retraction or dimpling, No nipple discharge or bleeding, No axillary or supraclavicular adenopathy Heart: regular rate and rhythm Abdomen: soft, non-tender; no masses, no organomegaly Extremities: extremities normal, atraumatic, no cyanosis or edema Skin: Skin color, texture, turgor normal. No rashes or lesions.  Pigmentation of face covered by make up. Lymph nodes: Cervical, supraclavicular, and axillary nodes normal. No abnormal inguinal nodes palpated Neurologic: Grossly normal  Pelvic: External genitalia:  no lesions              Urethra:  normal appearing urethra with no masses, tenderness or lesions              Bartholins and Skenes: normal                 Vagina: normal appearing vagina with normal color and discharge, no lesions              Cervix: no lesions  Pap taken: Yes.   Bimanual Exam:  Uterus:  normal size, contour, position, consistency, mobility, non-tender.  2 cm posterior fibroid.               Adnexa: no mass, fullness, tenderness              Rectal exam: Yes.  .  Confirms.              Anus:  normal sphincter tone, no lesions  Chaperone was present for exam.  Assessment:   Well woman visit with normal exam. Hx fibroid. Melasma. Constipation. Chronic.  Plan: Yearly mammogram recommended after age 21.  We will schedule for her. Recommended self breast exam.  Pap and HR HPV as above. Discussed Calcium, Vitamin D, regular exercise program including cardiovascular and weight bearing exercise. I discussed colonoscopy, and the patient declines. She understands that a GI specialist may be able to help her further with her constipation, and again, she declines  consultation. IFOB to patient.  Try Colace 100 mg daily.  Up to Date information to patient on melasma.  Follow up annually and prn.      After visit summary provided.

## 2016-02-18 NOTE — Patient Instructions (Signed)

## 2016-02-19 ENCOUNTER — Encounter: Payer: Self-pay | Admitting: Obstetrics and Gynecology

## 2016-02-20 LAB — IPS PAP TEST WITH HPV

## 2016-02-21 LAB — HEMOGLOBIN, FINGERSTICK: Hemoglobin, fingerstick: 12.8 g/dL (ref 12.0–16.0)

## 2016-02-21 LAB — FECAL OCCULT BLOOD, IMMUNOCHEMICAL: IFOBT: NEGATIVE

## 2016-02-21 NOTE — Addendum Note (Signed)
Addended by: Terence Lux A on: 02/21/2016 09:43 AM   Modules accepted: Orders

## 2016-04-14 ENCOUNTER — Ambulatory Visit
Admission: RE | Admit: 2016-04-14 | Discharge: 2016-04-14 | Disposition: A | Discharge status: Home / Self Care | Payer: BC Managed Care – PPO | Source: Ambulatory Visit | Attending: Obstetrics and Gynecology | Admitting: Obstetrics and Gynecology

## 2016-04-14 DIAGNOSIS — Z1231 Encounter for screening mammogram for malignant neoplasm of breast: Secondary | ICD-10-CM

## 2018-10-08 IMAGING — MG 2D DIGITAL SCREENING BILATERAL MAMMOGRAM WITH CAD AND ADJUNCT TO
9 of 13 series · 9 of 29 positions shown · non-contrast
Comparison: Previous exam(s).

CLINICAL DATA: Screening.

EXAM:
2D DIGITAL SCREENING BILATERAL MAMMOGRAM WITH CAD AND ADJUNCT TOMO

[L MLO (1 of 2)]
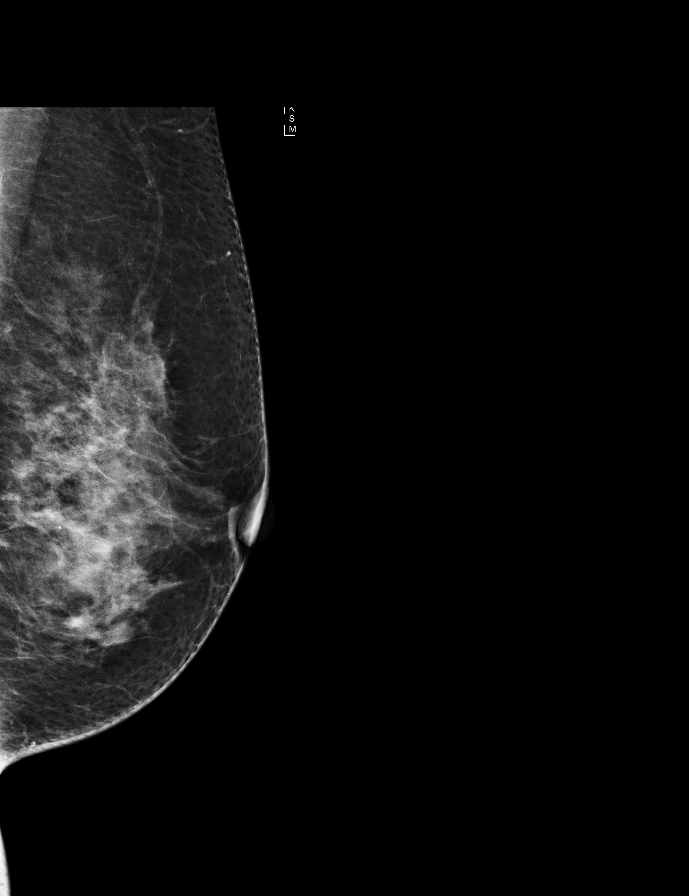

[L MLO synth-2D]
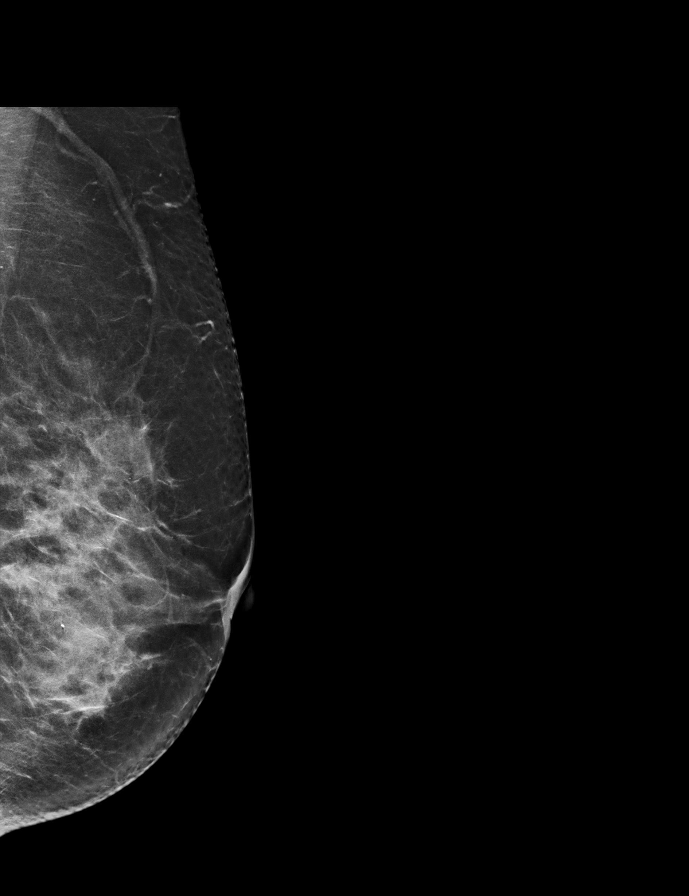

[R MLO synth-2D]
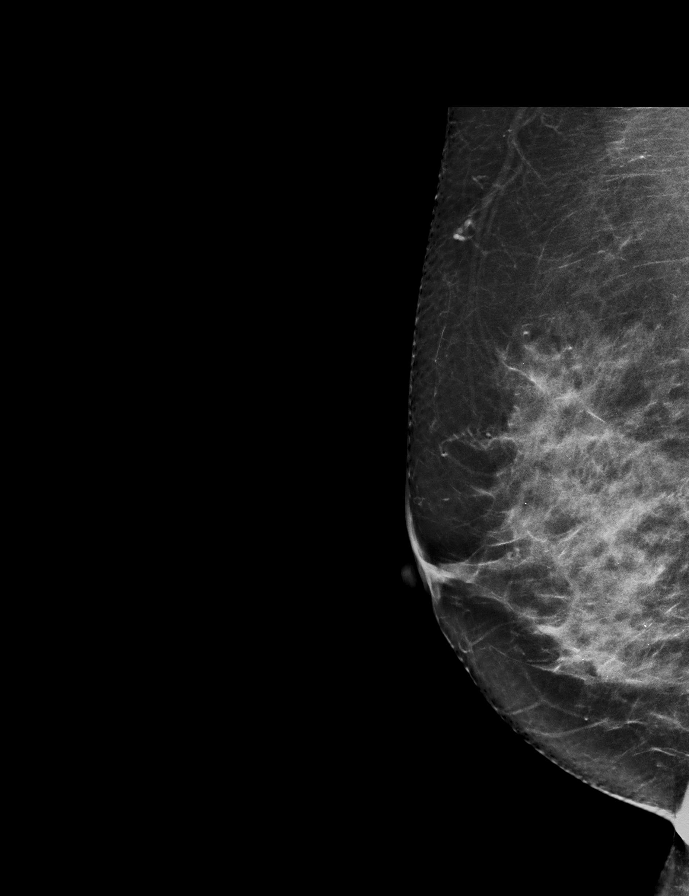

[L MLO (2 of 2)]
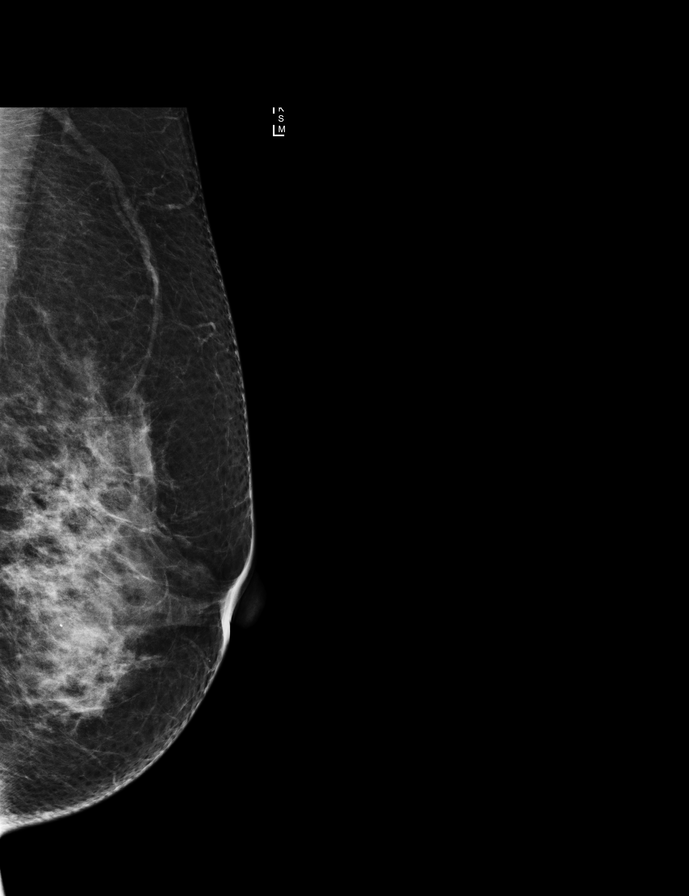

[L CC]
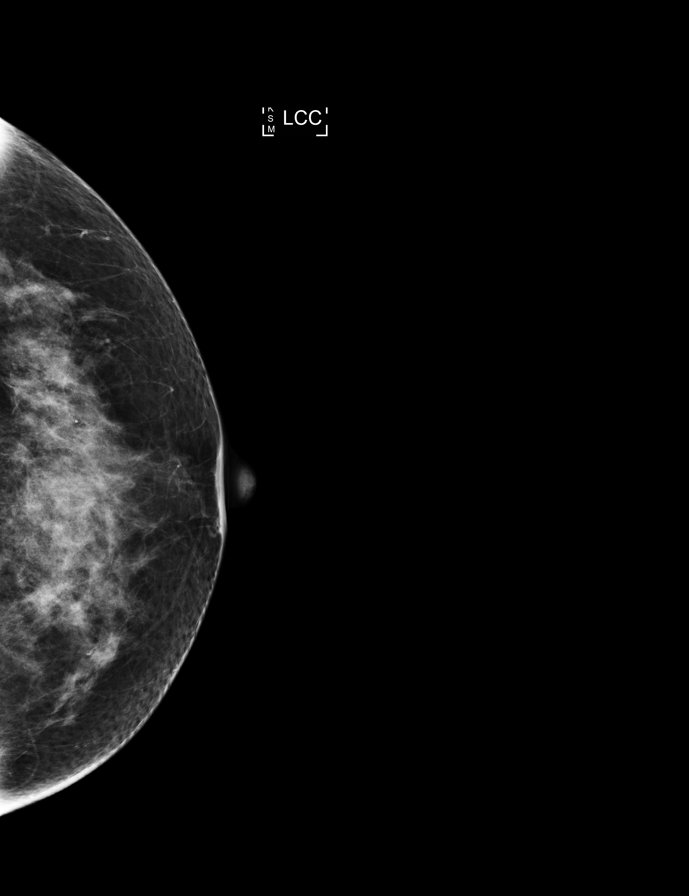

[R CC]
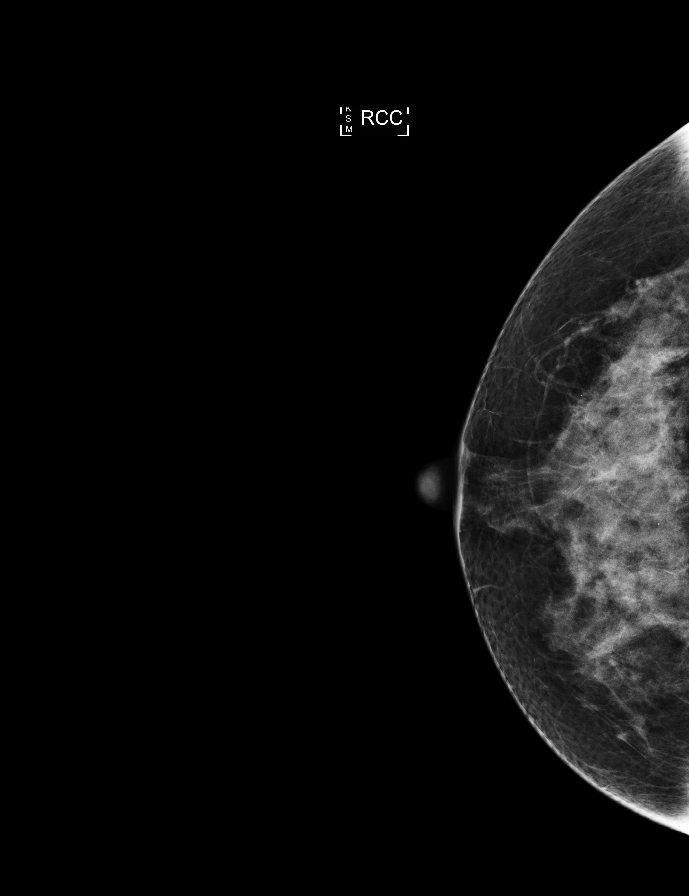

[L CC synth-2D]
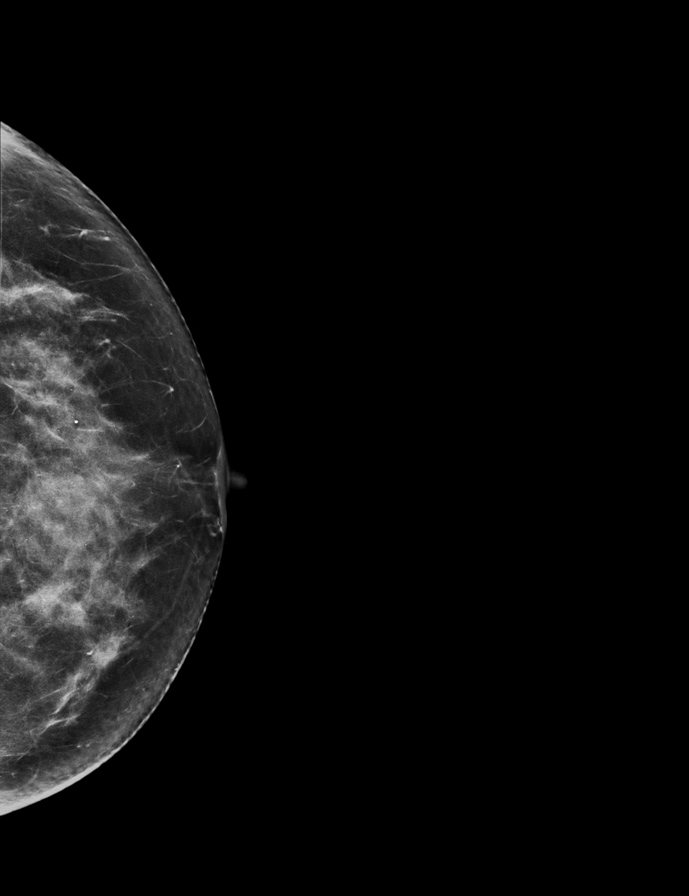

[R MLO]
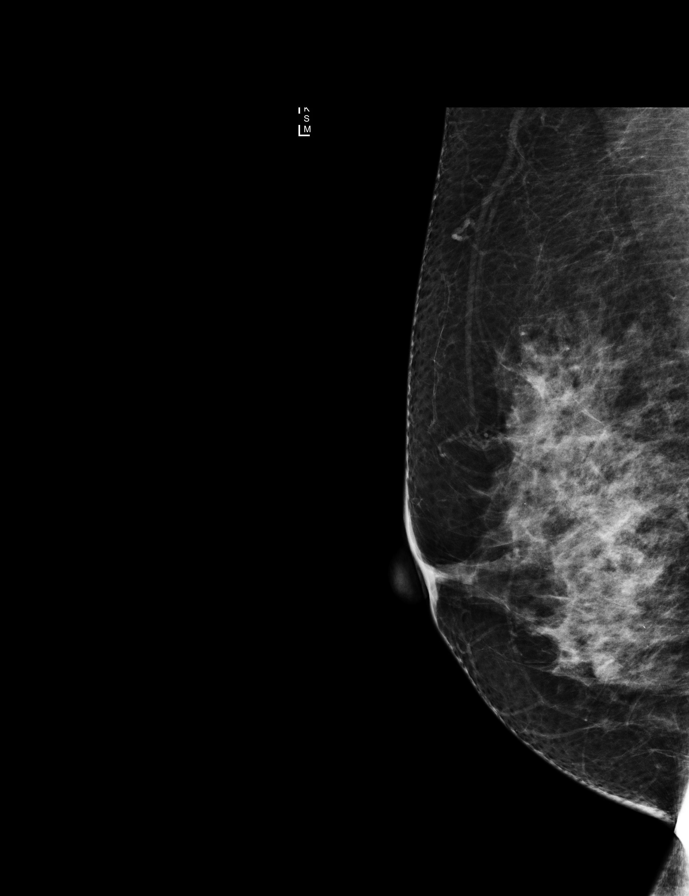

[R CC synth-2D]
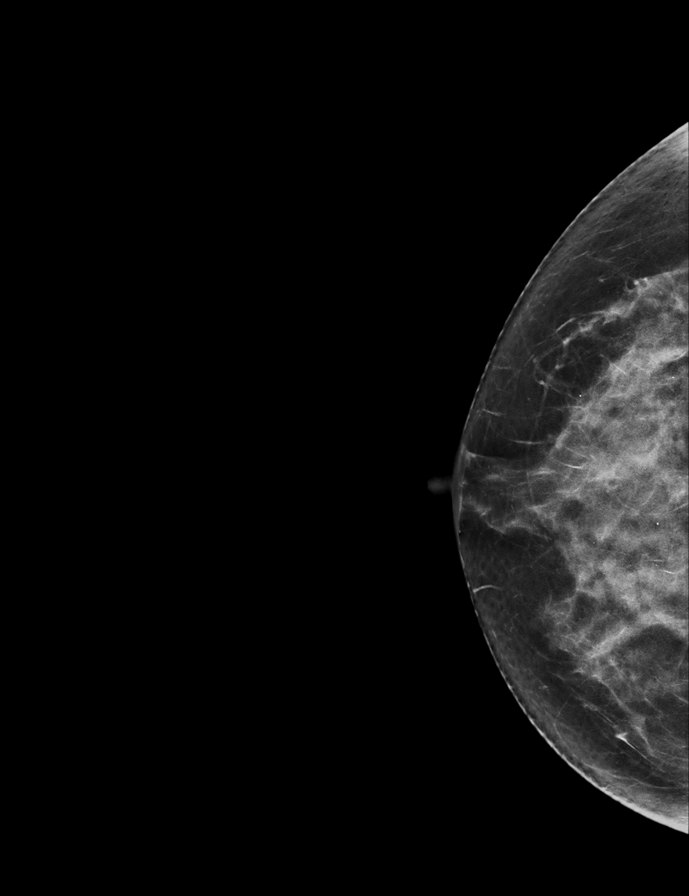

[9 of 29 positions shown; findings below may reference images not displayed]

ACR Breast Density Category c: The breast tissue is heterogeneously
dense, which may obscure small masses.
FINDINGS: There are no findings suspicious for malignancy. Images were
processed with CAD.
IMPRESSION: No mammographic evidence of malignancy. A result letter of this
screening mammogram will be mailed directly to the patient.

RECOMMENDATION:
Screening mammogram in one year. (Code:[id number])

BI-RADS CATEGORY  1: Negative.

## 2019-07-05 ENCOUNTER — Telehealth: Payer: Self-pay

## 2019-07-05 NOTE — Telephone Encounter (Signed)
Patient called wanting to be evaluated for COVID SX. Has not been seen by PCP here since 2014. Advised her she needs to be seen by an urgent care and let her know she needs to reestablish care.Patient only wants to see MD, refuses to reestablish. Will be evaluated at an urgent care and seek primary care elsewhere so she can have an MD
